# Patient Record
Sex: Female | Born: 1935 | Race: White | Hispanic: No | Smoking: Never smoker
Health system: Southern US, Community
[De-identification: ages and names within clinical notes are randomized; demographics above are authoritative.]

## PROBLEM LIST (undated history)

## (undated) DIAGNOSIS — S7292XA Unspecified fracture of left femur, initial encounter for closed fracture: Secondary | ICD-10-CM

## (undated) DIAGNOSIS — J302 Other seasonal allergic rhinitis: Secondary | ICD-10-CM

## (undated) DIAGNOSIS — W19XXXA Unspecified fall, initial encounter: Secondary | ICD-10-CM

## (undated) DIAGNOSIS — F329 Major depressive disorder, single episode, unspecified: Secondary | ICD-10-CM

## (undated) DIAGNOSIS — R296 Repeated falls: Secondary | ICD-10-CM

## (undated) DIAGNOSIS — I4891 Unspecified atrial fibrillation: Secondary | ICD-10-CM

## (undated) DIAGNOSIS — I10 Essential (primary) hypertension: Secondary | ICD-10-CM

## (undated) DIAGNOSIS — R110 Nausea: Secondary | ICD-10-CM

## (undated) HISTORY — PX: FRACTURE SURGERY: SHX138

## (undated) NOTE — *Deleted (*Deleted)
ANTICOAGULATION CONSULT NOTE - Follow Up Consult  Pharmacy Consult for heparin Indication: hx atrial fibrillation (home Eliquis on hold)  No Known Allergies  Patient Measurements: Height: 5\' 7"  (170.2 cm) Weight: 71.7 kg (158 lb) IBW/kg (Calculated) : 61.6 Heparin Dosing Weight: 72 kg  Vital Signs: Temp: 98.6 F (37 C) (11/11 0414) Temp Source: Oral (11/11 0414) BP: 120/56 (11/11 0553) Pulse Rate: 80 (11/11 0553)  Labs: Recent Labs    03/23/20 1240 03/23/20 1745 03/24/20 0603  HGB 10.6*  --  9.0*  HCT 33.3*  --  29.2*  PLT 409*  --  326  APTT  --  39* 106*  HEPARINUNFRC  --  >2.20* >2.20*  CREATININE 1.44*  --  1.10*    Estimated Creatinine Clearance: 33.7 mL/min (A) (by C-G formula based on SCr of 1.1 mg/dL (H)).   Medications:  - on Eliquis 5 mg bid PTA (last dose taken on 11/10 at 0600)  Assessment: Patient's an 82 y.o F with hx afib on Eliquis PTA presented to the ED from nursing facility on 11/10 with weakness and AMS. She was transitioned to heparin drip on admission.  Today, 03/24/2020: - heparin level is elevated but is expected d/t effect of Eliquis on lab value. APTT is  Slightly supra-therapeutic at 106 secs with rate currently running at 950 units/hr.  Will adjust heparin drip using aPTT for now until heparin level and aPTT correlate. Once they correlate, then will utilize heparin level alone for adjustment - hgb down slightly to 9, plts ok - no bleeding documented   Goal of Therapy:  Heparin level 0.3-0.7 units/ml aPTT 66-102 seconds Monitor platelets by anticoagulation protocol: Yes   Plan:  - decrease heparin drip to 8500 units/hr - check 8 hr aPTT - monitor for s/sx bleeding  Pham, Anh P 03/24/2020,7:09 AM

---

## 2018-09-08 DIAGNOSIS — S72452A Displaced supracondylar fracture without intracondylar extension of lower end of left femur, initial encounter for closed fracture: Secondary | ICD-10-CM | POA: Diagnosis present

## 2018-09-10 ENCOUNTER — Encounter (HOSPITAL_COMMUNITY): Payer: Self-pay | Admitting: *Deleted

## 2018-09-10 ENCOUNTER — Other Ambulatory Visit: Payer: Self-pay

## 2018-09-10 MED ORDER — TRANEXAMIC ACID-NACL 1000-0.7 MG/100ML-% IV SOLN
1000.0000 mg | INTRAVENOUS | Status: AC
  Administered 2018-09-11: 1000 mg via INTRAVENOUS
  Filled 2018-09-10: qty 100

## 2018-09-10 NOTE — H&P (Signed)
[facility]  [address]   [address] [name] [address] [phone]   A Division of [facility]   RE: Veronica Flowers, Flowers   [id number]      DOB: 1928-02-14 INITIAL EVALUATION:  09-08-18  REASON FOR VISIT: Follow-up left femur IM nail of her intertrochanteric fracture performed by Luiz Iron, MD at [facility] on 07-15-18.   [name] Kitchen HPI:   She had initially done well. After this she was discharged to [facility] in [place] which is why she is following up with me, at his request. She has had a few falls while there, one on 08-21-18 and one after. She has worsened in her ability to walk with pain around her thigh after these falls. She has been on Eliquis and aspirin at [facility]. She has a history of Afib  EXAMINATION: Well appearing female in no apparent distress. The left leg is obviously clinically shorter. She has some tenderness around the distal thigh and her knee. Her knee joint itself has felt a lot better since she had a cortisone shot there at one of her visits. She has a history of arthritis in the left knee.   IMAGES: I took multiple X-rays today. She unfortunately has suffered a fracture of her supracondylar femur at the tip of the nail.  Intertrochanteric fracture appears to be stable. I reviewed the X-rays  sent to me by Dr. Beverley Fiedler post operative.  This is a dramatically different appearance, it is clearly a new fracture of her distal femur.       ASSESSMENT/PLAN: I had a  long talk with her and her family. She suffered a destabilizing supracondylar fracture through the distal femur. Intertrochanteric fracture appears stable. I recommend admission to the hospital for an open reduction and internal fixation of the supracondylar femur as well as removal of her distal interlock screw.  We will plan on doing this with a lateral locking plate. I have made multiple attempts to get in touch with the staff physician at  [facility] and we will continue to try that.  I would like to hold her Eliquis starting Tuesday in anticipation of  the surgery. We will also use TXA to limit blood loss. I discussed preadmission today versus admission on the day of surgery. She and the family would prefer she stay at  [facility] and then have admission on the date of surgery so we will set her up for that. We will likely have a hospitalist consult and have her spend a day or two in observation in the hospital. For now, we will do toe touch weight bearing and hold off on physical therapy.    Jewel Baize.  Veronica Flowers Flowers, M.D.  Electronically verified by Jewel Baize. Veronica Flowers Flowers, M.D. TDMNorman Herrlich D:  09-08-18 T:  09-10-18

## 2018-09-10 NOTE — Pre-Procedure Instructions (Addendum)
    Veronica Flowers  09/10/2018     No Pharmacies Listed   Your procedure is scheduled on Thursday, September 11, 2018  Report to [facility] Admitting at 8:30 A.M.  Call this number if you have problems the morning of surgery:  [phone]   Remember: Brush your teeth the morning of surgery with your regular tooth paste.   Do not eat after midnight .  You may drink clear liquids until 4:30A.M . Clear liquids allowed are:                    Water, Juice (non-citric and without pulp), Carbonated beverages, Clear Tea, Black Coffee only, Plain Jell-O only, Gatorade and Plain Popsicles only    Take these medicines the morning of surgery with A SIP OF WATER :acetaminophen (TYLENOL),  digoxin (LANOXIN), metoprolol succinate (TOPROL-XL), sertraline (ZOLOFT), fluticasone (FLONASE)  nasal spray If needed: pain medication ( Tramadol  OR  Hydrocodone) Stop taking  vitamins, fish oil, Melatonin and herbal medications. Do not take any NSAIDs ie: Ibuprofen, Advil, Naproxen (Aleve), Motrin, BC and Goody Powder; stop now.  Do not wear jewelry, make-up or nail polish.  Do not wear lotions, powders, or perfumes, or deodorant.  Do not shave 48 hours prior to surgery.   Do not bring valuables to the hospital.  [facility] is not responsible for any belongings or valuables.  Contacts, dentures or bridgework may not be worn into surgery.  Leave your suitcase in the car.  After surgery it may be brought to your room. Patients discharged the day of surgery will not be allowed to drive home.  Please read over the following fact sheets that you were given.

## 2018-09-10 NOTE — Anesthesia Preprocedure Evaluation (Addendum)
Anesthesia Evaluation  Patient identified by MRN, date of birth, ID band Patient awake    Reviewed: Allergy & Precautions, NPO status , Patient's Chart, lab work & pertinent test results  Airway Mallampati: II  TM Distance: >3 FB Neck ROM: Full    Dental no notable dental hx. (+) Teeth Intact   Pulmonary    Pulmonary exam normal breath sounds clear to auscultation       Cardiovascular hypertension, Normal cardiovascular exam Rhythm:Regular Rate:Normal     Neuro/Psych    GI/Hepatic Neg liver ROS,   Endo/Other    Renal/GU negative Renal ROS     Musculoskeletal negative musculoskeletal ROS (+)   Abdominal   Peds  Hematology   Anesthesia Other Findings   Reproductive/Obstetrics                            Anesthesia Physical Anesthesia Plan  ASA: III  Anesthesia Plan: Spinal   Post-op Pain Management:    Induction: Intravenous  PONV Risk Score and Plan: 2 and Treatment may vary due to age or medical condition, Ondansetron and Dexamethasone  Airway Management Planned: Simple Face Mask and Natural Airway  Additional Equipment:   Intra-op Plan:   Post-operative Plan: Extubation in OR  Informed Consent: I have reviewed the patients History and Physical, chart, labs and discussed the procedure including the risks, benefits and alternatives for the proposed anesthesia with the patient or authorized representative who has indicated his/her understanding and acceptance.     Dental advisory given  Plan Discussed with: CRNA  Anesthesia Plan Comments: (Pt On aphixaban  dced after the 4/27 dose, plan spinal )       Anesthesia Quick Evaluation

## 2018-09-10 NOTE — Progress Notes (Signed)
SDW-Pre-op call completed by pt nurse, Anna Genre, LPN, of [facility]. Nurse was unable to complete SDW-pre-op assessment in its entirety due to lack of pt records at [facility]. Nurse stated that there was no record of surgical history, cardiologist and diagnostic studies. Nurse stated that pt last dose of both Aspirin and Eliquis was Monday as instructed by doctor. Nurse stated that pt has no impairment and is capable of completing assessment on DOS.  Nurse denies that pt tested positive for COVID-19.  Nurse denies that pt experienced the following symptoms:  Cough yes/no: No Fever (>100.76F)  yes/no: No Runny nose yes/no: No Sore throat yes/no: No Difficulty breathing/shortness of breath  yes/no: No  Have you or a family member traveled in the last 14 days and where? yes/no: No Nurse made aware of hospital visitation restrictions are in effect and the importance of the restrictions.  Nurse confirmed receipt of fax and verbalized understanding of all pre-op instructions.  PAT Nurse spoke with Tresa Endo, Surgical Coordinator, at surgeon's office regarding pt records. Tresa Endo denies having any cardiac records for pt stating that pt previous surgery was performed by " [facility] " in [place] where pt resides. Nurse requested records from Dr. Elwanda Brooklyn [facility]; awaiting response. PA,  Anesthesiology, asked to review pt history

## 2018-09-11 ENCOUNTER — Inpatient Hospital Stay (HOSPITAL_COMMUNITY): Payer: Medicare Other | Admitting: Physician Assistant

## 2018-09-11 ENCOUNTER — Encounter (HOSPITAL_COMMUNITY): Payer: Self-pay

## 2018-09-11 ENCOUNTER — Encounter (HOSPITAL_COMMUNITY)
Admission: RE | Disposition: A | Discharge status: Skilled Nursing Facility | Payer: Self-pay | Source: Skilled Nursing Facility | Attending: Orthopedic Surgery

## 2018-09-11 ENCOUNTER — Inpatient Hospital Stay (HOSPITAL_COMMUNITY)
Admission: RE | Admit: 2018-09-11 | Discharge: 2018-09-15 | DRG: 481 | Disposition: A | Discharge status: Skilled Nursing Facility | Payer: Medicare Other | Source: Skilled Nursing Facility | Attending: Orthopedic Surgery | Admitting: Orthopedic Surgery

## 2018-09-11 ENCOUNTER — Inpatient Hospital Stay (HOSPITAL_COMMUNITY): Payer: Medicare Other

## 2018-09-11 DIAGNOSIS — I10 Essential (primary) hypertension: Secondary | ICD-10-CM | POA: Diagnosis present

## 2018-09-11 DIAGNOSIS — Y92129 Unspecified place in nursing home as the place of occurrence of the external cause: Secondary | ICD-10-CM

## 2018-09-11 DIAGNOSIS — X58XXXD Exposure to other specified factors, subsequent encounter: Secondary | ICD-10-CM | POA: Diagnosis present

## 2018-09-11 DIAGNOSIS — R296 Repeated falls: Secondary | ICD-10-CM | POA: Diagnosis present

## 2018-09-11 DIAGNOSIS — Z419 Encounter for procedure for purposes other than remedying health state, unspecified: Secondary | ICD-10-CM

## 2018-09-11 DIAGNOSIS — Z9181 History of falling: Secondary | ICD-10-CM

## 2018-09-11 DIAGNOSIS — D649 Anemia, unspecified: Secondary | ICD-10-CM | POA: Diagnosis present

## 2018-09-11 DIAGNOSIS — F329 Major depressive disorder, single episode, unspecified: Secondary | ICD-10-CM | POA: Diagnosis present

## 2018-09-11 DIAGNOSIS — I4891 Unspecified atrial fibrillation: Secondary | ICD-10-CM | POA: Diagnosis not present

## 2018-09-11 DIAGNOSIS — W19XXXA Unspecified fall, initial encounter: Secondary | ICD-10-CM | POA: Diagnosis present

## 2018-09-11 DIAGNOSIS — Z79899 Other long term (current) drug therapy: Secondary | ICD-10-CM | POA: Diagnosis not present

## 2018-09-11 DIAGNOSIS — S72452A Displaced supracondylar fracture without intracondylar extension of lower end of left femur, initial encounter for closed fracture: Secondary | ICD-10-CM | POA: Diagnosis present

## 2018-09-11 DIAGNOSIS — I482 Chronic atrial fibrillation, unspecified: Secondary | ICD-10-CM | POA: Diagnosis present

## 2018-09-11 DIAGNOSIS — Z1159 Encounter for screening for other viral diseases: Secondary | ICD-10-CM | POA: Diagnosis not present

## 2018-09-11 DIAGNOSIS — F0390 Unspecified dementia without behavioral disturbance: Secondary | ICD-10-CM | POA: Diagnosis present

## 2018-09-11 DIAGNOSIS — Z7901 Long term (current) use of anticoagulants: Secondary | ICD-10-CM

## 2018-09-11 DIAGNOSIS — D62 Acute posthemorrhagic anemia: Secondary | ICD-10-CM | POA: Diagnosis not present

## 2018-09-11 DIAGNOSIS — S72142D Displaced intertrochanteric fracture of left femur, subsequent encounter for closed fracture with routine healing: Secondary | ICD-10-CM

## 2018-09-11 DIAGNOSIS — F0393 Unspecified dementia, unspecified severity, with mood disturbance: Secondary | ICD-10-CM | POA: Diagnosis present

## 2018-09-11 HISTORY — PX: ORIF FEMUR FRACTURE: SHX2119

## 2018-09-11 HISTORY — DX: Essential (primary) hypertension: I10

## 2018-09-11 HISTORY — DX: Unspecified fall, initial encounter: W19.XXXA

## 2018-09-11 HISTORY — DX: Nausea: R11.0

## 2018-09-11 HISTORY — DX: Unspecified fracture of left femur, initial encounter for closed fracture: S72.92XA

## 2018-09-11 HISTORY — DX: Major depressive disorder, single episode, unspecified: F32.9

## 2018-09-11 HISTORY — DX: Unspecified atrial fibrillation: I48.91

## 2018-09-11 HISTORY — DX: Repeated falls: R29.6

## 2018-09-11 HISTORY — DX: Other seasonal allergic rhinitis: J30.2

## 2018-09-11 LAB — BASIC METABOLIC PANEL
Anion gap: 11 (ref 5–15)
BUN: 14 mg/dL (ref 8–23)
CO2: 26 mmol/L (ref 22–32)
Calcium: 9.1 mg/dL (ref 8.9–10.3)
Chloride: 102 mmol/L (ref 98–111)
Creatinine, Ser: 0.97 mg/dL (ref 0.44–1.00)
GFR calc Af Amer: >60 mL/min (ref >60)
GFR calc non Af Amer: 52 mL/min — ABNORMAL LOW (ref >60)
Glucose, Bld: 116 mg/dL — ABNORMAL HIGH (ref 70–99)
Potassium: 4 mmol/L (ref 3.5–5.1)
Sodium: 139 mmol/L (ref 135–145)

## 2018-09-11 LAB — PROTIME-INR
INR: 1.2 (ref 0.8–1.2)
Prothrombin Time: 15.5 seconds — ABNORMAL HIGH (ref 11.4–15.2)

## 2018-09-11 LAB — CBC
HCT: 36.9 % (ref 36.0–46.0)
Hemoglobin: 11.6 g/dL — ABNORMAL LOW (ref 12.0–15.0)
MCH: 32.8 pg (ref 26.0–34.0)
MCHC: 31.4 g/dL (ref 30.0–36.0)
MCV: 104.2 fL — ABNORMAL HIGH (ref 80.0–100.0)
Platelets: 391 10*3/uL (ref 150–400)
RBC: 3.54 MIL/uL — ABNORMAL LOW (ref 3.87–5.11)
RDW: 14.9 % (ref 11.5–15.5)
WBC: 8.3 10*3/uL (ref 4.0–10.5)
nRBC: 0 % (ref 0.0–0.2)

## 2018-09-11 SURGERY — OPEN REDUCTION INTERNAL FIXATION (ORIF) DISTAL FEMUR FRACTURE
Anesthesia: Spinal | Site: Leg Upper | Laterality: Left

## 2018-09-11 MED ORDER — ACETAMINOPHEN 500 MG PO TABS
500.0000 mg | ORAL_TABLET | Freq: Four times a day (QID) | ORAL | Status: AC
  Administered 2018-09-11 – 2018-09-12 (×3): 500 mg via ORAL
  Filled 2018-09-11 (×3): qty 1

## 2018-09-11 MED ORDER — SUCCINYLCHOLINE CHLORIDE 200 MG/10ML IV SOSY
PREFILLED_SYRINGE | INTRAVENOUS | Status: AC
  Filled 2018-09-11: qty 10

## 2018-09-11 MED ORDER — METHOCARBAMOL 500 MG PO TABS
500.0000 mg | ORAL_TABLET | Freq: Four times a day (QID) | ORAL | Status: DC | PRN
  Administered 2018-09-11 – 2018-09-12 (×2): 500 mg via ORAL
  Filled 2018-09-11 (×2): qty 1

## 2018-09-11 MED ORDER — DIGOXIN 125 MCG PO TABS
0.1250 mg | ORAL_TABLET | Freq: Every day | ORAL | Status: DC
  Administered 2018-09-11 – 2018-09-15 (×5): 0.125 mg via ORAL
  Filled 2018-09-11 (×5): qty 1

## 2018-09-11 MED ORDER — METOCLOPRAMIDE HCL 5 MG/ML IJ SOLN: 5.0000 mg | Freq: Three times a day (TID) | INTRAMUSCULAR | Status: DC | PRN

## 2018-09-11 MED ORDER — METHOCARBAMOL 1000 MG/10ML IJ SOLN
500.0000 mg | Freq: Four times a day (QID) | INTRAVENOUS | Status: DC | PRN
  Filled 2018-09-11 (×2): qty 5

## 2018-09-11 MED ORDER — APIXABAN 5 MG PO TABS
5.0000 mg | ORAL_TABLET | Freq: Two times a day (BID) | ORAL | Status: DC
  Administered 2018-09-12 – 2018-09-15 (×7): 5 mg via ORAL
  Filled 2018-09-11 (×7): qty 1

## 2018-09-11 MED ORDER — CEFAZOLIN SODIUM-DEXTROSE 2-4 GM/100ML-% IV SOLN
2.0000 g | INTRAVENOUS | Status: AC
  Administered 2018-09-11: 2 g via INTRAVENOUS
  Filled 2018-09-11: qty 100

## 2018-09-11 MED ORDER — ONDANSETRON HCL 4 MG/2ML IJ SOLN: 4.0000 mg | Freq: Four times a day (QID) | INTRAMUSCULAR | Status: DC | PRN

## 2018-09-11 MED ORDER — POLYETHYLENE GLYCOL 3350 17 G PO PACK: 17.0000 g | PACK | Freq: Every day | ORAL | Status: DC | PRN

## 2018-09-11 MED ORDER — HYDROMORPHONE HCL 1 MG/ML IJ SOLN: 0.2500 mg | INTRAMUSCULAR | Status: DC | PRN

## 2018-09-11 MED ORDER — PHENYLEPHRINE 40 MCG/ML (10ML) SYRINGE FOR IV PUSH (FOR BLOOD PRESSURE SUPPORT)
PREFILLED_SYRINGE | INTRAVENOUS | Status: AC
  Filled 2018-09-11: qty 10

## 2018-09-11 MED ORDER — LIDOCAINE 2% (20 MG/ML) 5 ML SYRINGE
INTRAMUSCULAR | Status: AC
  Filled 2018-09-11: qty 5

## 2018-09-11 MED ORDER — ONDANSETRON HCL 4 MG/2ML IJ SOLN
INTRAMUSCULAR | Status: AC
  Filled 2018-09-11: qty 2

## 2018-09-11 MED ORDER — ONDANSETRON HCL 4 MG PO TABS: 4.0000 mg | ORAL_TABLET | Freq: Four times a day (QID) | ORAL | Status: DC | PRN

## 2018-09-11 MED ORDER — POLYETHYLENE GLYCOL 3350 17 G PO PACK
17.0000 g | PACK | Freq: Every day | ORAL | Status: DC
  Administered 2018-09-12 – 2018-09-14 (×3): 17 g via ORAL
  Filled 2018-09-11 (×4): qty 1

## 2018-09-11 MED ORDER — METOCLOPRAMIDE HCL 5 MG PO TABS: 5.0000 mg | ORAL_TABLET | Freq: Three times a day (TID) | ORAL | Status: DC | PRN

## 2018-09-11 MED ORDER — ARTIFICIAL TEARS OPHTHALMIC OINT
TOPICAL_OINTMENT | OPHTHALMIC | Status: AC
  Filled 2018-09-11: qty 3.5

## 2018-09-11 MED ORDER — ACETAMINOPHEN 10 MG/ML IV SOLN: 1000.0000 mg | Freq: Once | INTRAVENOUS | Status: DC | PRN

## 2018-09-11 MED ORDER — BUPIVACAINE IN DEXTROSE 0.75-8.25 % IT SOLN
INTRATHECAL | Status: DC | PRN
  Administered 2018-09-11: 12 mg via INTRATHECAL

## 2018-09-11 MED ORDER — ACETAMINOPHEN 500 MG PO TABS
1000.0000 mg | ORAL_TABLET | Freq: Once | ORAL | Status: AC
  Administered 2018-09-11: 1000 mg via ORAL
  Filled 2018-09-11: qty 2

## 2018-09-11 MED ORDER — FLUTICASONE PROPIONATE 50 MCG/ACT NA SUSP
1.0000 | Freq: Two times a day (BID) | NASAL | Status: DC
  Administered 2018-09-11 – 2018-09-15 (×8): 1 via NASAL
  Filled 2018-09-11: qty 16

## 2018-09-11 MED ORDER — LACTATED RINGERS IV SOLN
INTRAVENOUS | Status: DC
  Administered 2018-09-11: 16:00:00 via INTRAVENOUS
  Administered 2018-09-13: 07:00:00 75 mL/h via INTRAVENOUS

## 2018-09-11 MED ORDER — SERTRALINE HCL 50 MG PO TABS
50.0000 mg | ORAL_TABLET | Freq: Every day | ORAL | Status: DC
  Administered 2018-09-12 – 2018-09-15 (×4): 50 mg via ORAL
  Filled 2018-09-11 (×4): qty 1

## 2018-09-11 MED ORDER — EPHEDRINE 5 MG/ML INJ
INTRAVENOUS | Status: AC
  Filled 2018-09-11: qty 10

## 2018-09-11 MED ORDER — DEXAMETHASONE SODIUM PHOSPHATE 10 MG/ML IJ SOLN
INTRAMUSCULAR | Status: AC
  Filled 2018-09-11: qty 1

## 2018-09-11 MED ORDER — SENNA 8.6 MG PO TABS
1.0000 | ORAL_TABLET | Freq: Every day | ORAL | Status: DC
  Administered 2018-09-11 – 2018-09-14 (×4): 8.6 mg via ORAL
  Filled 2018-09-11 (×4): qty 1

## 2018-09-11 MED ORDER — FENTANYL CITRATE (PF) 250 MCG/5ML IJ SOLN
INTRAMUSCULAR | Status: AC
  Filled 2018-09-11: qty 5

## 2018-09-11 MED ORDER — GLYCOPYRROLATE PF 0.2 MG/ML IJ SOSY
PREFILLED_SYRINGE | INTRAMUSCULAR | Status: AC
  Filled 2018-09-11: qty 1

## 2018-09-11 MED ORDER — ONDANSETRON HCL 4 MG/2ML IJ SOLN
INTRAMUSCULAR | Status: DC | PRN
  Administered 2018-09-11: 4 mg via INTRAVENOUS

## 2018-09-11 MED ORDER — NEOSTIGMINE METHYLSULFATE 3 MG/3ML IV SOSY
PREFILLED_SYRINGE | INTRAVENOUS | Status: AC
  Filled 2018-09-11: qty 3

## 2018-09-11 MED ORDER — SODIUM CHLORIDE (PF) 0.9 % IJ SOLN
INTRAMUSCULAR | Status: AC
  Filled 2018-09-11: qty 10

## 2018-09-11 MED ORDER — ONDANSETRON HCL 4 MG/2ML IJ SOLN
INTRAMUSCULAR | Status: AC
  Filled 2018-09-11: qty 4

## 2018-09-11 MED ORDER — CEFAZOLIN SODIUM-DEXTROSE 2-4 GM/100ML-% IV SOLN
2.0000 g | Freq: Four times a day (QID) | INTRAVENOUS | Status: AC
  Administered 2018-09-11 – 2018-09-12 (×3): 2 g via INTRAVENOUS
  Filled 2018-09-11 (×3): qty 100

## 2018-09-11 MED ORDER — SODIUM CHLORIDE 0.9 % IV SOLN
INTRAVENOUS | Status: DC | PRN
  Administered 2018-09-11: 50 ug/min via INTRAVENOUS

## 2018-09-11 MED ORDER — PROPOFOL 500 MG/50ML IV EMUL
INTRAVENOUS | Status: DC | PRN
  Administered 2018-09-11: 25 ug/kg/min via INTRAVENOUS

## 2018-09-11 MED ORDER — ASPIRIN EC 81 MG PO TBEC
81.0000 mg | DELAYED_RELEASE_TABLET | Freq: Every day | ORAL | Status: DC
  Administered 2018-09-12 – 2018-09-15 (×4): 81 mg via ORAL
  Filled 2018-09-11 (×4): qty 1

## 2018-09-11 MED ORDER — ACETAMINOPHEN 325 MG PO TABS
325.0000 mg | ORAL_TABLET | Freq: Four times a day (QID) | ORAL | Status: DC | PRN
  Administered 2018-09-12 – 2018-09-14 (×2): 650 mg via ORAL
  Filled 2018-09-11 (×2): qty 2

## 2018-09-11 MED ORDER — LACTATED RINGERS IV SOLN
INTRAVENOUS | Status: DC
  Administered 2018-09-11: 12:00:00 via INTRAVENOUS

## 2018-09-11 MED ORDER — ONDANSETRON HCL 4 MG/2ML IJ SOLN: 4.0000 mg | Freq: Once | INTRAMUSCULAR | Status: DC | PRN

## 2018-09-11 MED ORDER — CHLORHEXIDINE GLUCONATE 4 % EX LIQD: 60.0000 mL | Freq: Once | CUTANEOUS | Status: DC

## 2018-09-11 MED ORDER — FENTANYL CITRATE (PF) 100 MCG/2ML IJ SOLN: 25.0000 ug | INTRAMUSCULAR | Status: DC | PRN

## 2018-09-11 MED ORDER — HYDROCODONE-ACETAMINOPHEN 5-325 MG PO TABS
1.0000 | ORAL_TABLET | Freq: Four times a day (QID) | ORAL | Status: DC | PRN
  Administered 2018-09-11 – 2018-09-12 (×2): 1 via ORAL
  Filled 2018-09-11 (×2): qty 1

## 2018-09-11 MED ORDER — TRAMADOL HCL 50 MG PO TABS
25.0000 mg | ORAL_TABLET | Freq: Four times a day (QID) | ORAL | Status: DC | PRN
  Administered 2018-09-11 – 2018-09-12 (×2): 25 mg via ORAL
  Filled 2018-09-11 (×2): qty 1

## 2018-09-11 MED ORDER — PHENYLEPHRINE HCL (PRESSORS) 10 MG/ML IV SOLN
INTRAVENOUS | Status: DC | PRN
  Administered 2018-09-11: 120 ug via INTRAVENOUS
  Administered 2018-09-11: 80 ug via INTRAVENOUS

## 2018-09-11 MED ORDER — METOPROLOL SUCCINATE ER 100 MG PO TB24
100.0000 mg | ORAL_TABLET | Freq: Two times a day (BID) | ORAL | Status: DC
  Administered 2018-09-11 – 2018-09-15 (×6): 100 mg via ORAL
  Filled 2018-09-11 (×8): qty 1

## 2018-09-11 SURGICAL SUPPLY — 63 items
BANDAGE ACE 4X5 VEL STRL LF (GAUZE/BANDAGES/DRESSINGS) ×3 IMPLANT
BANDAGE ACE 6X5 VEL STRL LF (GAUZE/BANDAGES/DRESSINGS) ×3 IMPLANT
BIT DRILL QC 3.3X195 (BIT) ×3 IMPLANT
BLADE CLIPPER SURG (BLADE) IMPLANT
BNDG ELASTIC 6X10 VLCR STRL LF (GAUZE/BANDAGES/DRESSINGS) ×3 IMPLANT
BNDG GAUZE ELAST 4 BULKY (GAUZE/BANDAGES/DRESSINGS) ×3 IMPLANT
CABLE CERLAGE W/CRIMP 1.8 (Cable) ×2 IMPLANT
CABLE CERLAGE W/CRIMP 1.8MM (Cable) ×1 IMPLANT
CAP LOCK NCB (Cap) ×27 IMPLANT
COVER WAND RF STERILE (DRAPES) IMPLANT
DRAPE C-ARM 42X72 X-RAY (DRAPES) ×3 IMPLANT
DRAPE C-ARMOR (DRAPES) ×3 IMPLANT
DRAPE IMP U-DRAPE 54X76 (DRAPES) ×3 IMPLANT
DRAPE ORTHO SPLIT 77X108 STRL (DRAPES) ×4
DRAPE SURG ORHT 6 SPLT 77X108 (DRAPES) ×2 IMPLANT
DRAPE U-SHAPE 47X51 STRL (DRAPES) ×3 IMPLANT
DRSG ADAPTIC 3X8 NADH LF (GAUZE/BANDAGES/DRESSINGS) ×3 IMPLANT
DRSG PAD ABDOMINAL 8X10 ST (GAUZE/BANDAGES/DRESSINGS) ×12 IMPLANT
ELECT REM PT RETURN 9FT ADLT (ELECTROSURGICAL) ×3
ELECTRODE REM PT RTRN 9FT ADLT (ELECTROSURGICAL) ×1 IMPLANT
GAUZE SPONGE 4X4 12PLY STRL (GAUZE/BANDAGES/DRESSINGS) ×3 IMPLANT
GLOVE BIO SURGEON STRL SZ7.5 (GLOVE) ×6 IMPLANT
GLOVE BIOGEL PI IND STRL 8 (GLOVE) ×2 IMPLANT
GLOVE BIOGEL PI INDICATOR 8 (GLOVE) ×4
GOWN STRL REUS W/ TWL LRG LVL3 (GOWN DISPOSABLE) ×4 IMPLANT
GOWN STRL REUS W/ TWL XL LVL3 (GOWN DISPOSABLE) ×1 IMPLANT
GOWN STRL REUS W/TWL LRG LVL3 (GOWN DISPOSABLE) ×8
GOWN STRL REUS W/TWL XL LVL3 (GOWN DISPOSABLE) ×2
K-WIRE 2.0 (WIRE) ×4
K-WIRE FXSTD 280X2XNS SS (WIRE) ×2
KIT BASIN OR (CUSTOM PROCEDURE TRAY) ×3 IMPLANT
KIT TURNOVER KIT B (KITS) ×3 IMPLANT
KWIRE FXSTD 280X2XNS SS (WIRE) ×2 IMPLANT
LOCKPLATE CABLE BUTTON NCP HIP (Orthopedic Implant) ×3 IMPLANT
MANIFOLD NEPTUNE II (INSTRUMENTS) ×3 IMPLANT
NS IRRIG 1000ML POUR BTL (IV SOLUTION) ×3 IMPLANT
PACK TOTAL JOINT (CUSTOM PROCEDURE TRAY) ×3 IMPLANT
PACK UNIVERSAL I (CUSTOM PROCEDURE TRAY) ×3 IMPLANT
PAD ABD 8X10 STRL (GAUZE/BANDAGES/DRESSINGS) ×6 IMPLANT
PAD ARMBOARD 7.5X6 YLW CONV (MISCELLANEOUS) ×6 IMPLANT
PAD CAST 4YDX4 CTTN HI CHSV (CAST SUPPLIES) ×1 IMPLANT
PADDING CAST COTTON 4X4 STRL (CAST SUPPLIES) ×2
PADDING CAST COTTON 6X4 STRL (CAST SUPPLIES) ×3 IMPLANT
PLATE BONE LOCK 238MM 9HOLE (Plate) ×3 IMPLANT
SCREW 5.0 60MM (Screw) ×3 IMPLANT
SCREW CORT NCB SELFTAP 5.0X50 (Screw) ×3 IMPLANT
SCREW CORTICAL 5.0X10MM (Screw) ×9 IMPLANT
SCREW CORTICAL 5.0X14 (Screw) ×3 IMPLANT
SCREW CORTICAL NCB 5.0X40 (Screw) ×3 IMPLANT
SCREW CORTICAL NCB 5.0X44 (Screw) ×3 IMPLANT
SCREW CORTICAL NCB 5.0X65 (Screw) ×3 IMPLANT
SCREW NCB 4.0X20MM (Screw) ×3 IMPLANT
SCREW NCB 5.0X55MM (Screw) ×3 IMPLANT
STAPLER VISISTAT 35W (STAPLE) IMPLANT
SUT MNCRL AB 4-0 PS2 18 (SUTURE) ×3 IMPLANT
SUT MON AB 2-0 CT1 27 (SUTURE) ×3 IMPLANT
SUT VIC AB 0 CT1 27 (SUTURE) ×4
SUT VIC AB 0 CT1 27XBRD ANBCTR (SUTURE) ×2 IMPLANT
TOWEL OR 17X24 6PK STRL BLUE (TOWEL DISPOSABLE) ×3 IMPLANT
TOWEL OR 17X26 10 PK STRL BLUE (TOWEL DISPOSABLE) ×6 IMPLANT
TOWEL OR NON WOVEN STRL DISP B (DISPOSABLE) ×3 IMPLANT
TRAY FOLEY MTR SLVR 16FR STAT (SET/KITS/TRAYS/PACK) IMPLANT
WATER STERILE IRR 1000ML POUR (IV SOLUTION) ×6 IMPLANT

## 2018-09-11 NOTE — Consult Note (Signed)
Medical Consultation   Veronica GarnetMary Flowers  WUJ:[id number]  DOB: 03-04-1928  DOA: 09/11/2018  PCP: System, Pcp Not In   Outpatient Specialists: Dr Eulah PontMurphy: Orthopedics   Requesting physician: Dr. Eulah PontMurphy from orthopedics  Reason for consultation: Medical management of hypertension and atrial fibrillation   History of Present Illness: Veronica GarnetMary Flowers is an 85 y.o. female very poor historian who was recuperating from a left femur IM nail of an intertrochanteric fracture performed by Dr. Luiz Ironlint McNabb at [facility] on 07/15/2018.  She had initially done well and was discharged to [facility] in [place] which has had a few falls while at [facility] the last 1 on 08/21/2018 and then 1 after that.  Billy to walk has declined with significant pain around her thigh after these falls.  She is on Eliquis and aspirin due to history of atrial fibrillation.  Evaluation by Dr. Eulah PontMurphy on the 29th in his office revealed supracondylar femur x-ray at the tip of the nail.  Clearly had developed a new fracture since Dr. Eulah PontMurphy was able to compare the x-rays from Dr. Arville LimeMcNabb's postoperative x-rays.  She presented today for open reduction internal fixation of the supracondylar femur and removal of distal interlock screw.  And her medical history of hypertension, atrial fibrillation and what appears to be some mild dementia I was consulted for medical management.    Review of Systems:  Review of Systems  Unable to perform ROS: Dementia     Past Medical History: Past Medical History:  Diagnosis Date  . A-fib (HCC)   . Falls   . Femur fracture, left (HCC)    supracondylar  . Hypertension   . Major depressive disorder   . Nausea   . Seasonal allergies     Past Surgical History: Past Surgical History:  Procedure Laterality Date  . FRACTURE SURGERY     left femur     Allergies:  No Known Allergies   Social History:  reports that she has never smoked. She has never used smokeless tobacco.  She reports previous alcohol use. She reports that she does not use drugs.   Family History: Family History  Family history unknown: Yes      Physical Exam: Vitals:   09/11/18 1510 09/11/18 1516 09/11/18 1531 09/11/18 1559  BP:  (!) 89/55 (!) 109/36 106/76  Pulse:   (!) 59 60  Resp:   (!) 21 (!) 22  Temp: 97.6 F (36.4 C)     TempSrc:      SpO2:   100% 98%  Weight:      Height:        Constitutional: Elderly confused alert and awake, oriented x3, not in any acute distress. Eyes: PERLA, EOMI, irises appear normal, anicteric sclera,  ENMT: external ears and nose appear normal, normal hearing            Lips appears normal, oropharynx mucosa, tongue, posterior pharynx appear normal  Neck: neck appears normal, no masses, normal ROM, no thyromegaly, no JVD  CVS: Irregularly irregular S1-S2 clear, no murmur rubs or gallops, no LE edema, normal pedal pulses  Respiratory:  clear to auscultation bilaterally, no wheezing, rales or rhonchi. Respiratory effort normal. No accessory muscle use.  Abdomen: soft nontender, nondistended, normal bowel sounds, no hepatosplenomegaly, no hernias  Musculoskeletal: : no cyanosis, clubbing or edema noted bilaterally leg in an immobilizer no evidence of bleeding through the dressing Neuro: Cranial nerves II-XII intact,  strength, sensation, reflexes Psych: judgement and insight appear normal, stable mood and affect, mental status Skin: no rashes or lesions or ulcers, no induration or nodules    Data reviewed:  I have personally reviewed following labs and imaging studies Labs:  CBC: Recent Labs  Lab 09/11/18 1004  WBC 8.3  HGB 11.6*  HCT 36.9  MCV 104.2*  PLT 391    Basic Metabolic Panel: Recent Labs  Lab 09/11/18 1004  NA 139  K 4.0  CL 102  CO2 26  GLUCOSE 116*  BUN 14  CREATININE 0.97  CALCIUM 9.1   GFR Estimated Creatinine Clearance: 42.3 mL/min (by C-G formula based on SCr of 0.97 mg/dL).  Coagulation profile Recent Labs   Lab 09/11/18 1004  INR 1.2    Microbiology No results found for this or any previous visit (from the past 240 hour(s)).     Inpatient Medications:   Scheduled Meds: . acetaminophen  500 mg Oral Q6H  . [START ON 09/12/2018] apixaban  5 mg Oral BID  . [START ON 09/12/2018] aspirin EC  81 mg Oral Daily  . digoxin  0.125 mg Oral Daily  . fluticasone  1 spray Each Nare BID  . metoprolol succinate  100 mg Oral BID  . polyethylene glycol  17 g Oral Daily  . senna  1 tablet Oral QHS  . [START ON 09/12/2018] sertraline  50 mg Oral Daily   Continuous Infusions: .  ceFAZolin (ANCEF) IV    . lactated ringers 50 mL/hr at 09/11/18 1608  . methocarbamol (ROBAXIN) IV       Radiological Exams on Admission: Dg C-arm 1-60 Min  Result Date: 09/11/2018 CLINICAL DATA:  85 year old female with ORIF femur EXAM: DG C-ARM 61-120 MIN; LEFT FEMUR 2 VIEWS COMPARISON:  None. FINDINGS: Multiple intraoperative fluoroscopic spot images of left femur. Surgical changes of open reduction internal fixation with antegrade intramedullary rod and buttress plate and screw fixation. IMPRESSION: Limited intraoperative fluoroscopic spot images of ORIF of left femur. Please refer to the dictated operative report for full details of intraoperative findings and procedure. Electronically Signed   By: Gilmer Mor D.O.   On: 09/11/2018 14:45   Dg Femur Min 2 Views Left  Result Date: 09/11/2018 CLINICAL DATA:  85 year old female with ORIF femur EXAM: DG C-ARM 61-120 MIN; LEFT FEMUR 2 VIEWS COMPARISON:  None. FINDINGS: Multiple intraoperative fluoroscopic spot images of left femur. Surgical changes of open reduction internal fixation with antegrade intramedullary rod and buttress plate and screw fixation. IMPRESSION: Limited intraoperative fluoroscopic spot images of ORIF of left femur. Please refer to the dictated operative report for full details of intraoperative findings and procedure. Electronically Signed   By: Gilmer Mor  D.O.   On: 09/11/2018 14:45    Impression/Recommendations Principal Problem:   Displaced supracondylar fracture without intracondylar extension of lower end of left femur, initial encounter for closed fracture [facility]) Active Problems:   Closed displaced supracondylar fracture of distal end of left femur without intracondylar extension (HCC)   Unspecified atrial fibrillation (HCC)   Chronic anticoagulation   Essential hypertension   Dementia, presenile with depression (HCC)   1.  Displaced subcondylar fracture without intercondylar extension of lower end of left femur and close displaced supracondylar fracture of distal end of left femur: Judgment poor orthopedics.  2.  Unspecified atrial fibrillation: Patient is anticoagulated with Eliquis and takes metoprolol and digoxin.  We will continue these home medications.  It is currently well controlled.  3.  Chronic anticoagulation:  Noted as above.  4.  Essential hypertension: Currently blood pressure well controlled with her metoprolol.  5.  Dementia presenile with depression: Very slow to respond to questions not able to really give me too many answers.  Continue sertraline.  Thank you for this consultation.  Our [facility] hospitalist team will follow the patient with you.   Time Spent: 47 minutes  Lahoma Crocker M.D. [facility] 09/11/2018, 5:05 PM

## 2018-09-11 NOTE — Anesthesia Procedure Notes (Signed)
Spinal  Patient location during procedure: OR Start time: 09/11/2018 12:35 PM End time: 09/11/2018 12:42 PM Staffing Anesthesiologist: Trevor Iha, MD Preanesthetic Checklist Completed: patient identified, site marked, surgical consent, pre-op evaluation, timeout performed, IV checked, risks and benefits discussed and monitors and equipment checked Spinal Block Patient position: sitting Prep: DuraPrep Patient monitoring: heart rate, cardiac monitor, continuous pulse ox and blood pressure Approach: midline Location: L3-4 Injection technique: single-shot Needle Needle type: Sprotte  Needle gauge: 24 G Needle length: 9 cm Assessment Sensory level: T4 Additional Notes 1 attempt . Patient tolerated procedure well.

## 2018-09-11 NOTE — Interval H&P Note (Signed)
I participated in the care of this patient and agree with the above history, physical and evaluation. I performed a review of the history and a physical exam as detailed   Mikeisha Lemonds Daniel Rafiel Mecca MD  

## 2018-09-11 NOTE — Progress Notes (Signed)
Patient admitted to [facility] room1. S/P ORIF left distal femur fracture. Patient sl. Confused. Easily re-oriented. Call bell in reach.  Ace wrap and knee immobilizer intact. Toes cool to touch. Pt able to wiggle toes. Sensation intact. Continue to monitor.

## 2018-09-11 NOTE — Transfer of Care (Signed)
Immediate Anesthesia Transfer of Care Note  Patient: Veronica Flowers  Procedure(s) Performed: OPEN REDUCTION INTERNAL FIXATION (ORIF) DISTAL FEMUR FRACTURE (Left Leg Upper)  Patient Location: PACU  Anesthesia Type:Spinal  Level of Consciousness: awake  Airway & Oxygen Therapy: Patient Spontanous Breathing and Patient connected to nasal cannula oxygen  Post-op Assessment: Report given to RN and Post -op Vital signs reviewed and stable  Post vital signs: Reviewed and stable  Last Vitals: See flowsheet Vitals Value Taken Time  BP    Temp    Pulse    Resp    SpO2      Last Pain:  Vitals:   09/11/18 1659  TempSrc:   PainSc: 10-Worst pain ever      Patients Stated Pain Goal: 3 (09/11/18 0921)  Complications: No apparent anesthesia complications

## 2018-09-11 NOTE — Anesthesia Postprocedure Evaluation (Signed)
Anesthesia Post Note  Patient: Veronica Flowers  Procedure(s) Performed: OPEN REDUCTION INTERNAL FIXATION (ORIF) DISTAL FEMUR FRACTURE (Left Leg Upper)     Patient location during evaluation: Nursing Unit Anesthesia Type: Spinal Level of consciousness: oriented and awake and alert Pain management: pain level controlled Vital Signs Assessment: post-procedure vital signs reviewed and stable Respiratory status: spontaneous breathing and respiratory function stable Cardiovascular status: blood pressure returned to baseline and stable Postop Assessment: no headache, no backache, no apparent nausea or vomiting and patient able to bend at knees Anesthetic complications: no    Last Vitals:  Vitals:   09/11/18 1810 09/11/18 2026  BP: 128/64 (!) 128/45  Pulse: 78 66  Resp: 16 17  Temp: 36.7 C (!) 36.4 C  SpO2: 100% 100%    Last Pain:  Vitals:   09/11/18 2026  TempSrc: Oral  PainSc:                  Trevor Iha

## 2018-09-12 ENCOUNTER — Encounter (HOSPITAL_COMMUNITY): Payer: Self-pay | Admitting: Orthopedic Surgery

## 2018-09-12 LAB — CBC
HCT: 27.6 % — ABNORMAL LOW (ref 36.0–46.0)
Hemoglobin: 8.7 g/dL — ABNORMAL LOW (ref 12.0–15.0)
MCH: 32.7 pg (ref 26.0–34.0)
MCHC: 31.5 g/dL (ref 30.0–36.0)
MCV: 103.8 fL — ABNORMAL HIGH (ref 80.0–100.0)
Platelets: 310 10*3/uL (ref 150–400)
RBC: 2.66 MIL/uL — ABNORMAL LOW (ref 3.87–5.11)
RDW: 14.7 % (ref 11.5–15.5)
WBC: 9.5 10*3/uL (ref 4.0–10.5)
nRBC: 0 % (ref 0.0–0.2)

## 2018-09-12 LAB — MRSA PCR SCREENING: MRSA by PCR: NEGATIVE

## 2018-09-12 NOTE — Discharge Instructions (Signed)
Elevate leg at all times to reduce pain and swelling.   Pump foot up and down frequently to reduce swelling in your foot and lower leg.  Diet: As you were doing prior to hospitalization.  Dressing:  Keep dressings on and dry until follow up.  Weight Bearing:   50% weight bearing.  Okay for Range of Motion at the knee.  Knee immobilizer for comfort when ambulating.  To prevent constipation: you may use a stool softener such as -  Colace (over the counter) 100 mg by mouth twice a day  Drink plenty of fluids (prune juice may be helpful) and high fiber foods Miralax (over the counter) for constipation as needed.    Itching:  If you experience itching with your medications, try taking only a single pain pill, or even half a pain pill at a time.  You can also use benadryl over the counter for itching or also to help with sleep.   Precautions:  If you experience chest pain or shortness of breath - call 911 immediately for transfer to the hospital emergency department!!  If you develop a fever greater that 101 F, purulent drainage from wound, increased redness or drainage from wound, or calf pain -- Call the office at [phone]                                                 Follow- Up Appointment:  Please call for an appointment to be seen in 1-2 weeks [facility] - [phone]

## 2018-09-12 NOTE — Clinical Social Work Note (Signed)
Patient from [facility]. PT evaluation pending. Will follow for therapy recommendations.  Charlynn Court, CSW [phone]

## 2018-09-12 NOTE — Progress Notes (Signed)
[facility] PROGRESS NOTE  Veronica GarnetMary Flowers ZOX:[id number] DOB: 1927/09/12 DOA: 09/11/2018 PCP: System, Pcp Not In   Consult requested by dr Eulah Pontmurphy Reason consult: medical management of HTN and afib  Assessment/Plan:  1.  Displaced subcondylar fracture without intercondylar extension of lower end of left femur and close displaced supracondylar fracture of distal end of left femur: Judgment poor orthopedics.  2.  Unspecified atrial fibrillation: reate controlled.  Patient remains anticoagulated with Eliquis and takes metoprolol and digoxin.  recommend continuing these home medications.    3.  Chronic anticoagulation: Noted as above.  4.  Essential hypertension: controlled. Recommend continuing home regimen  5.  Dementia present with depression: Awake alert. appears stable at baseline this am. Continue sertraline.  Ok for discharge from medicine perspective  Thank you for opportunity to participate in this patient's care.     Code Status: full Family Communication:  Disposition Plan: back to facility once PT evaaluates     Procedures:    Antibiotics:    HPI/Subjective: Consult for medical management of HTN and afib after ORIF 4/30  Objective: Vitals:   09/12/18 0828 09/12/18 1248  BP: 122/62 (!) 109/38  Pulse: 80 73  Resp:    Temp:  97.8 F (36.6 C)  SpO2: 99% 94%    Intake/Output Summary (Last 24 hours) at 09/12/2018 1256 Last data filed at 09/12/2018 0600 Gross per 24 hour  Intake 1837.46 ml  Output 300 ml  Net 1537.46 ml   Filed Weights   09/11/18 0903  Weight: 74.8 kg    Exam:   General:  Awake alert no acute distress  Cardiovascular: irregularly irregular no mgr no LE edema  Respiratory: normal effort BS clear bilaterally no wheeze  Abdomen: +BS no guarding or rebounding  Musculoskeletal: joints without swelling/erythema   Data Reviewed: Basic Metabolic Panel: Recent Labs  Lab 09/11/18 1004  NA 139  K 4.0  CL 102  CO2 26   GLUCOSE 116*  BUN 14  CREATININE 0.97  CALCIUM 9.1   Liver Function Tests: No results for input(s): AST, ALT, ALKPHOS, BILITOT, PROT, ALBUMIN in the last 168 hours. No results for input(s): LIPASE, AMYLASE in the last 168 hours. No results for input(s): AMMONIA in the last 168 hours. CBC: Recent Labs  Lab 09/11/18 1004 09/12/18 0114  WBC 8.3 9.5  HGB 11.6* 8.7*  HCT 36.9 27.6*  MCV 104.2* 103.8*  PLT 391 310   Cardiac Enzymes: No results for input(s): CKTOTAL, CKMB, CKMBINDEX, TROPONINI in the last 168 hours. BNP (last 3 results) No results for input(s): BNP in the last 8760 hours.  ProBNP (last 3 results) No results for input(s): PROBNP in the last 8760 hours.  CBG: No results for input(s): GLUCAP in the last 168 hours.  Recent Results (from the past 240 hour(s))  MRSA PCR Screening     Status: None   Collection Time: 09/11/18 11:12 PM  Result Value Ref Range Status   MRSA by PCR NEGATIVE NEGATIVE Final    Comment:        The GeneXpert MRSA Assay (FDA approved for NASAL specimens only), is one component of a comprehensive MRSA colonization surveillance program. It is not intended to diagnose MRSA infection nor to guide or monitor treatment for MRSA infections. Performed at [facility], [address] [place], [place] [address]      Studies: Dg C-arm 1-60 Min  Result Date: 09/11/2018 CLINICAL DATA:  85 year old female with ORIF femur EXAM: DG C-ARM 61-120 MIN; LEFT  FEMUR 2 VIEWS COMPARISON:  None. FINDINGS: Multiple intraoperative fluoroscopic spot images of left femur. Surgical changes of open reduction internal fixation with antegrade intramedullary rod and buttress plate and screw fixation. IMPRESSION: Limited intraoperative fluoroscopic spot images of ORIF of left femur. Please refer to the dictated operative report for full details of intraoperative findings and procedure. Electronically Signed   By: Gilmer Mor D.O.   On: 09/11/2018 14:45   Dg  Femur Min 2 Views Left  Result Date: 09/11/2018 CLINICAL DATA:  85 year old female with ORIF femur EXAM: DG C-ARM 61-120 MIN; LEFT FEMUR 2 VIEWS COMPARISON:  None. FINDINGS: Multiple intraoperative fluoroscopic spot images of left femur. Surgical changes of open reduction internal fixation with antegrade intramedullary rod and buttress plate and screw fixation. IMPRESSION: Limited intraoperative fluoroscopic spot images of ORIF of left femur. Please refer to the dictated operative report for full details of intraoperative findings and procedure. Electronically Signed   By: Gilmer Mor D.O.   On: 09/11/2018 14:45    Scheduled Meds: . acetaminophen  500 mg Oral Q6H  . apixaban  5 mg Oral BID  . aspirin EC  81 mg Oral Daily  . digoxin  0.125 mg Oral Daily  . fluticasone  1 spray Each Nare BID  . metoprolol succinate  100 mg Oral BID  . polyethylene glycol  17 g Oral Daily  . senna  1 tablet Oral QHS  . sertraline  50 mg Oral Daily   Continuous Infusions: . lactated ringers 75 mL/hr at 09/12/18 0725  . methocarbamol (ROBAXIN) IV      Principal Problem:   Displaced supracondylar fracture without intracondylar extension of lower end of left femur, initial encounter for closed fracture (HCC) Active Problems:   Closed displaced supracondylar fracture of distal end of left femur without intracondylar extension (HCC)   Unspecified atrial fibrillation (HCC)   Chronic anticoagulation   Essential hypertension   Dementia, presenile with depression (HCC)    Time spent: 30 minutes    [facility] M  [facility]  If 7PM-7AM, please contact night-coverage at [email or web address], password [facility] 09/12/2018, 12:56 PM  LOS: 1 day

## 2018-09-12 NOTE — Evaluation (Signed)
Physical Therapy Evaluation Patient Details Name: Veronica Flowers MRN: [id number] DOB: 10-27-1927 Today's Date: 09/12/2018   History of Present Illness  Pt is an 85 y.o. female with recent intertrochanteric fx s/p IM nail (07/15/18) with d/c to SNF, now [facility] after multiple falls with difficulty walking. Pt with L supracondylar femur fx, s/p ORIF 09/11/18. PMH includes afib, HTN, dementia.    Clinical Impression  Pt presents with an overall decrease in functional mobility secondary to above. Pt poor historian; per chart, from [facility]. Today, pt required maxA to stand pivot with RW; pt limited by generalized weakness and difficulty accepting weight through LLE. Pt would benefit from continued acute PT services to maximize functional mobility and independence prior to d/c with SNF-level therapies.     Follow Up Recommendations SNF;Supervision/Assistance - 24 hour    Equipment Recommendations  (TBD)    Recommendations for Other Services       Precautions / Restrictions Precautions Precautions: Fall Restrictions Weight Bearing Restrictions: Yes LLE Weight Bearing: Partial weight bearing LLE Partial Weight Bearing Percentage or Pounds: 50% Other Position/Activity Restrictions: Knee ROM okay      Mobility  Bed Mobility Overal bed mobility: Needs Assistance Bed Mobility: Supine to Sit     Supine to sit: Max assist;HOB elevated     General bed mobility comments: Pt not initiating movement despite max cues; eventually requiring maxA to initiate and move LLE, pt able to assist minimally with RLE. MaxA for trunk elevation with UE support  Transfers Overall transfer level: Needs assistance Equipment used: Rolling walker (2 wheeled) Transfers: Sit to/from BJ'sStand;Stand Pivot Transfers Sit to Stand: Max assist;From elevated surface Stand pivot transfers: Max assist;From elevated surface       General transfer comment: MaxA to  assist trunk elevation, pt able to assist with BUEs but unable to accept weight onto LLE, easily fatigued. Stood again, unable to initiate steps, requiring maxA to pivot to recliner  Ambulation/Gait                Stairs            Wheelchair Mobility    Modified Rankin (Stroke Patients Only)       Balance Overall balance assessment: Needs assistance   Sitting balance-Leahy Scale: Fair       Standing balance-Leahy Scale: Zero Standing balance comment: BUE support and maxA to maintain standing                             Pertinent Vitals/Pain Pain Assessment: Faces Faces Pain Scale: Hurts a little bit Pain Location: LLE Pain Descriptors / Indicators: Guarding;Grimacing Pain Intervention(s): Limited activity within patient's tolerance;Repositioned    Home Living Family/patient expects to be discharged to:: Skilled nursing facility                 Additional Comments: Pt poor historian, reports [place] at [facility] and son lives next door. Per chart, from [facility]    Prior Function Level of Independence: Needs assistance   Gait / Transfers Assistance Needed: Pt poor historian. Reports using RW, then reports she doesn't use anything  ADL's / Homemaking Assistance Needed: Reports she gets assist with bathing and some meals provided, but does not need help with anything else        Hand Dominance        Extremity/Trunk Assessment   Upper Extremity Assessment Upper Extremity Assessment:  Generalized weakness    Lower Extremity Assessment Lower Extremity Assessment: Generalized weakness;LLE deficits/detail LLE Deficits / Details: Observed functional strength <3/5 LLE Coordination: decreased gross motor;decreased fine motor       Communication   Communication: HOH  Cognition Arousal/Alertness: Awake/alert Behavior During Therapy: Flat affect Overall Cognitive Status: History of cognitive impairments - at  baseline Area of Impairment: Orientation;Attention;Memory;Following commands;Safety/judgement;Awareness;Problem solving                 Orientation Level: Disoriented to;Place;Time;Situation Current Attention Level: Sustained Memory: Decreased short-term memory;Decreased recall of precautions Following Commands: Follows one step commands with increased time Safety/Judgement: Decreased awareness of safety;Decreased awareness of deficits Awareness: Intellectual Problem Solving: Requires verbal cues General Comments: Baseline dementia      General Comments      Exercises     Assessment/Plan    PT Assessment Patient needs continued PT services  PT Problem List Decreased strength;Decreased range of motion;Decreased activity tolerance;Decreased balance;Decreased mobility;Decreased cognition;Decreased knowledge of use of DME;Decreased safety awareness;Decreased knowledge of precautions;Pain       PT Treatment Interventions DME instruction;Gait training;Functional mobility training;Therapeutic activities;Therapeutic exercise;Balance training;Patient/family education    PT Goals (Current goals can be found in the Care Plan section)  Acute Rehab PT Goals Patient Stated Goal: None stated PT Goal Formulation: With patient Time For Goal Achievement: 09/26/18 Potential to Achieve Goals: Fair    Frequency Min 3X/week   Barriers to discharge        Co-evaluation               AM-PAC PT "6 Clicks" Mobility  Outcome Measure Help needed turning from your back to your side while in a flat bed without using bedrails?: A Lot Help needed moving from lying on your back to sitting on the side of a flat bed without using bedrails?: A Lot Help needed moving to and from a bed to a chair (including a wheelchair)?: A Lot Help needed standing up from a chair using your arms (e.g., wheelchair or bedside chair)?: A Lot Help needed to walk in hospital room?: Total Help needed climbing  3-5 steps with a railing? : Total 6 Click Score: 10    End of Session Equipment Utilized During Treatment: Gait belt Activity Tolerance: Patient tolerated treatment well Patient left: in chair;with call bell/phone within reach;with chair alarm set Nurse Communication: Mobility status PT Visit Diagnosis: Other abnormalities of gait and mobility (R26.89);Muscle weakness (generalized) (M62.81)    Time: 4098-1191 PT Time Calculation (min) (ACUTE ONLY): 27 min   Charges:   PT Evaluation $PT Eval Moderate Complexity: 1 Mod PT Treatments $Therapeutic Activity: 8-22 mins       Ina Homes, PT, DPT Acute Rehabilitation Services  Pager [phone] Office [phone]  Malachy Chamber 09/12/2018, 4:56 PM

## 2018-09-12 NOTE — Progress Notes (Signed)
History: The patient had IM nail of her intertrochanteric fracture performed by Luiz Ironlint McNabb, MD at [facility] on 07-15-18.  She had initially done well. After this she was discharged [facility] in [place]. She has had a few falls while there, one on 08-21-18 and one after. She has worsened in her ability to walk with pain around her thigh after these falls. She has been on Eliquis and aspirin at [facility]. She has a history of Afib.  X-rays were taken on 09/08/2018 follow-up with Dr. Eulah PontMurphy.  She unfortunately has suffered a fracture of her supracondylar femur at the tip of the nail.  She underwent ORIF 09/11/2018.  Subjective: Patient reports pain as moderate, controlled.  Tolerating some diet.  Urinating - smartwick in place. No CP, SOB.  Not yet mobilized.  Objective:   VITALS:   Vitals:   09/11/18 1717 09/11/18 1810 09/11/18 2026 09/12/18 0435  BP: 129/66 128/64 (!) 128/45 124/66  Pulse: 69 78 66 65  Resp:  16 17 17   Temp:  98 F (36.7 C) (!) 97.5 F (36.4 C) 97.7 F (36.5 C)  TempSrc:  Oral Oral Oral  SpO2:  100% 100% 100%  Weight:      Height:       CBC Latest Ref Rng & Units 09/12/2018 09/11/2018  WBC 4.0 - 10.5 K/uL 9.5 8.3  Hemoglobin 12.0 - 15.0 g/dL 2.9(F8.7(L) 11.6(L)  Hematocrit 36.0 - 46.0 % 27.6(L) 36.9  Platelets 150 - 400 K/uL 310 391   BMP Latest Ref Rng & Units 09/11/2018  Glucose 70 - 99 mg/dL 621(H116(H)  BUN 8 - 23 mg/dL 14  Creatinine 0.860.44 - 5.781.00 mg/dL 4.690.97  Sodium 629135 - 528145 mmol/L 139  Potassium 3.5 - 5.1 mmol/L 4.0  Chloride 98 - 111 mmol/L 102  CO2 22 - 32 mmol/L 26  Calcium 8.9 - 10.3 mg/dL 9.1   Intake/Output      04/30 0701 - 05/01 0700 05/01 0701 - 05/02 0700   P.O. 120    I.V. (mL/kg) 1517.5 (20.3)    IV Piggyback 200    Total Intake(mL/kg) 1837.5 (24.6)    Urine (mL/kg/hr) 150    Blood 150    Total Output 300    Net +1537.5            Physical Exam: General: NAD.  Supine in bed.  Sleepy but conversant and responds appropriately to  questions. Resp: No increased wob Cardio: regular rate ABD soft Neurologically intact MSK LLE: Feet warm.  Ace wrap and dressings in place, CDI. EHL, FHL, dorsiflexion, plantarflexion intact.  Gross sensation intact distally.  Assessment: 1 Day Post-Op  S/P Procedure(s) (LRB): OPEN REDUCTION INTERNAL FIXATION (ORIF) DISTAL FEMUR FRACTURE (Left) by Dr. Jewel Baizeimothy D. Eulah PontMurphy on 09/11/2018  Principal Problem:   Displaced supracondylar fracture without intracondylar extension of lower end of left femur, initial encounter for closed fracture [facility](HCC) Active Problems:   Closed displaced supracondylar fracture of distal end of left femur without intracondylar extension (HCC)   Unspecified atrial fibrillation (HCC)   Chronic anticoagulation   Essential hypertension   Dementia, presenile with depression (HCC)  ABLA: Hemoglobin 8.7<11.6.  No sign of active bleeding.  Asymptomatic.  Likely with delutional component.  Continue to follow hemoglobin and symptoms.  Closed displaced supracondylar femur fracture after IM nail Now status post ORIF left distal femur.  AFVSN Pain controlled Tolerating some diet and voiding Not yet mobilized- previously utilizing a walker but given multiple falls and new injury, she has  needed significant assistance.  Plan: Up with therapy D/C IV fluids when tolerating adequate p.o. Incentive Spirometry Elevate and Apply ice Appreciate medical team following for chronic conditions.  Weightbearing: PWB 50 % LLE.  Okay for knee range of motion Insicional and dressing care: Dressings left intact until follow-up Orthopedic device(s): Knee immobilizer for comfort when mobilizing. Showering: Keep dressing dry VTE prophylaxis: Resume Eliquis and 81 mg aspirin, SCDs, ambulation Pain control: Tylenol for mild pain.  Tramadol for moderate pain.  Norco for severe pain.  Dilaudid for breakthrough pain not controlled p.o. medicines. Follow - up plan: 1 to 2 weeks in the office  with Dr. Eulah Pont.   Dispo: Remain inpatient pending therapy evaluation.  Discharge back to SNF [facility]) when mobilized and stable from a medical perspective.     Anticipated LOS equal to or greater than 2 midnights due to - Age 24 and older with one or more of the following:  - Obesity  - Expected need for hospital services (PT, OT, Nursing) required for safe  discharge  - Anticipated need for postoperative skilled nursing care or inpatient rehab  - Active co-morbidities: Cardiac Arrhythmia   Albina Billet III, PA-C 09/12/2018, 7:05 AM

## 2018-09-13 LAB — CBC
HCT: 25.5 % — ABNORMAL LOW (ref 36.0–46.0)
Hemoglobin: 8.1 g/dL — ABNORMAL LOW (ref 12.0–15.0)
MCH: 32.4 pg (ref 26.0–34.0)
MCHC: 31.8 g/dL (ref 30.0–36.0)
MCV: 102 fL — ABNORMAL HIGH (ref 80.0–100.0)
Platelets: 283 10*3/uL (ref 150–400)
RBC: 2.5 MIL/uL — ABNORMAL LOW (ref 3.87–5.11)
RDW: 15.3 % (ref 11.5–15.5)
WBC: 10.7 10*3/uL — ABNORMAL HIGH (ref 4.0–10.5)
nRBC: 0 % (ref 0.0–0.2)

## 2018-09-13 NOTE — Progress Notes (Signed)
Notified [facility] of patient's blood pressure 120/49.

## 2018-09-13 NOTE — Progress Notes (Signed)
Called son, Harvie Heck and daughter in law and gave them an update on pt progress.  Ortho also came in and spoke with the family on the telephone on pt condition.  Pt will need SNF.

## 2018-09-13 NOTE — Progress Notes (Addendum)
Patient ID: Veronica Flowers, female   DOB: 1927/10/25, 85 y.o.   MRN: [id number]     Subjective:  Patient reports pain as mild to moderate.  Patient in bed and in no acute distress.  Alert and follows commands but confused about where she is.  Talked with family on the telephone and that is about baseline for her.    Objective:   VITALS:   Vitals:   09/12/18 0435 09/12/18 0828 09/12/18 1248 09/13/18 0555  BP: 124/66 122/62 (!) 109/38 (!) 120/49  Pulse: 65 80 73 71  Resp: 17     Temp: 97.7 F (36.5 C)  97.8 F (36.6 C) 98.4 F (36.9 C)  TempSrc: Oral  Oral Oral  SpO2: 100% 99% 94%   Weight:      Height:        ABD soft Sensation intact distally Dorsiflexion/Plantar flexion intact Incision: dressing C/D/I and no drainage Knee immobilizer in place   Lab Results  Component Value Date   WBC 10.7 (H) 09/13/2018   HGB 8.1 (L) 09/13/2018   HCT 25.5 (L) 09/13/2018   MCV 102.0 (H) 09/13/2018   PLT 283 09/13/2018   BMET    Component Value Date/Time   NA 139 09/11/2018 1004   K 4.0 09/11/2018 1004   CL 102 09/11/2018 1004   CO2 26 09/11/2018 1004   GLUCOSE 116 (H) 09/11/2018 1004   BUN 14 09/11/2018 1004   CREATININE 0.97 09/11/2018 1004   CALCIUM 9.1 09/11/2018 1004   GFRNONAA 52 (L) 09/11/2018 1004   GFRAA >60 09/11/2018 1004     Assessment/Plan: 2 Days Post-Op   Principal Problem:   Displaced supracondylar fracture without intracondylar extension of lower end of left femur, initial encounter for closed fracture (HCC) Active Problems:   Closed displaced supracondylar fracture of distal end of left femur without intracondylar extension (HCC)   Unspecified atrial fibrillation (HCC)   Chronic anticoagulation   Essential hypertension   Dementia, presenile with depression (HCC)   Advance diet Up with therapy Dry dressing PRN 50% WB left lower ext Knee immobilizer at all times Patient will likely need SNF Consult CSW for possible placement if [facility]  assisted living is not enough     Nelda Severe 09/13/2018, 10:10 AM  ABLA observe.  Agree with above.  Teryl Lucy, MD Cell [phone]

## 2018-09-13 NOTE — Progress Notes (Signed)
PROGRESS NOTE    Veronica GarnetMary Flowers  JXB:[id number] DOB: Sep 24, 1927 DOA: 09/11/2018 PCP: System, Pcp Not In    Brief Narrative:  85 year old female with recent femoral shaft fracture status post ORIF admitted after fall and found to have periprosthetic fracture.   Assessment & Plan:   Principal Problem:   Displaced supracondylar fracture without intracondylar extension of lower end of left femur, initial encounter for closed fracture [facility](HCC) Active Problems:   Closed displaced supracondylar fracture of distal end of left femur without intracondylar extension (HCC)   Unspecified atrial fibrillation (HCC)   Chronic anticoagulation   Essential hypertension   Dementia, presenile with depression (HCC)  1. Displaced subcondylar fracture without intercondylar extension of lower end of left femur and closed displaced supracondylar fracture of distal end of left femur:  Status post ORIF.  Date to postop today.  Enough pain medications.  PT OT.  Patient may need SNF placement. Many SNF now required COVID-19 test.  Will order one.  2.  Chronic atrial fibrillation: reate controlled.  Patient remains anticoagulated with Eliquis and takes metoprolol and digoxin. recommend continuing these home medications.   3. Anemia: As anticipated from multiple procedures and surgery.  Currently no evidence of active bleeding.  Hemoglobin 8.1.  No indication for transfusion at this time.  4. Essential hypertension: controlled. Recommend continuing home regimen  5.Dementia present with depression: Awake alert. appears stable at baseline this am. Continue sertraline.  Ok for discharge from medicine perspective  Thank you for opportunity to participate in this patient's care.    DVT prophylaxis: Eliquis Code Status: Full code Family Communication: No family at bedside.  Orthopedics communicated with family. Disposition Plan: SNF when bed available.   Consultants:   Orthopedics  Procedures:    ORIF left femur  Antimicrobials:   None   Subjective: Patient seen and examined in the morning.  She was sleepy.  No other overnight events.  Denied any pain.  Objective: Vitals:   09/12/18 0435 09/12/18 0828 09/12/18 1248 09/13/18 0555  BP: 124/66 122/62 (!) 109/38 (!) 120/49  Pulse: 65 80 73 71  Resp: 17     Temp: 97.7 F (36.5 C)  97.8 F (36.6 C) 98.4 F (36.9 C)  TempSrc: Oral  Oral Oral  SpO2: 100% 99% 94%   Weight:      Height:        Intake/Output Summary (Last 24 hours) at 09/13/2018 1352 Last data filed at 09/13/2018 0500 Gross per 24 hour  Intake 1251.9 ml  Output 500 ml  Net 751.9 ml   Filed Weights   09/11/18 0903  Weight: 74.8 kg    Examination:  General exam: Appears calm and comfortable, sleepy. Respiratory system: Clear to auscultation. Respiratory effort normal. Cardiovascular system: S1 & S2 heard, RRR. No JVD, murmurs, rubs, gallops or clicks. No pedal edema. Gastrointestinal system: Abdomen is nondistended, soft and nontender. No organomegaly or masses felt. Normal bowel sounds heard. Central nervous system: Alert and oriented. No focal neurological deficits. Extremities: Symmetric 5 x 5 power. Left knee on immobilizer.  Distal neurovascular status intact. Skin: No rashes, lesions or ulcers Psychiatry: Judgement and insight appear normal. Mood & affect appropriate.     Data Reviewed: I have personally reviewed following labs and imaging studies  CBC: Recent Labs  Lab 09/11/18 1004 09/12/18 0114 09/13/18 0151  WBC 8.3 9.5 10.7*  HGB 11.6* 8.7* 8.1*  HCT 36.9 27.6* 25.5*  MCV 104.2* 103.8* 102.0*  PLT 391 310 283   Basic Metabolic  Panel: Recent Labs  Lab 09/11/18 1004  NA 139  K 4.0  CL 102  CO2 26  GLUCOSE 116*  BUN 14  CREATININE 0.97  CALCIUM 9.1   GFR: Estimated Creatinine Clearance: 42.3 mL/min (by C-G formula based on SCr of 0.97 mg/dL). Liver Function Tests: No results for input(s): AST, ALT, ALKPHOS, BILITOT,  PROT, ALBUMIN in the last 168 hours. No results for input(s): LIPASE, AMYLASE in the last 168 hours. No results for input(s): AMMONIA in the last 168 hours. Coagulation Profile: Recent Labs  Lab 09/11/18 1004  INR 1.2   Cardiac Enzymes: No results for input(s): CKTOTAL, CKMB, CKMBINDEX, TROPONINI in the last 168 hours. BNP (last 3 results) No results for input(s): PROBNP in the last 8760 hours. HbA1C: No results for input(s): HGBA1C in the last 72 hours. CBG: No results for input(s): GLUCAP in the last 168 hours. Lipid Profile: No results for input(s): CHOL, HDL, LDLCALC, TRIG, CHOLHDL, LDLDIRECT in the last 72 hours. Thyroid Function Tests: No results for input(s): TSH, T4TOTAL, FREET4, T3FREE, THYROIDAB in the last 72 hours. Anemia Panel: No results for input(s): VITAMINB12, FOLATE, FERRITIN, TIBC, IRON, RETICCTPCT in the last 72 hours. Sepsis Labs: No results for input(s): PROCALCITON, LATICACIDVEN in the last 168 hours.  Recent Results (from the past 240 hour(s))  MRSA PCR Screening     Status: None   Collection Time: 09/11/18 11:12 PM  Result Value Ref Range Status   MRSA by PCR NEGATIVE NEGATIVE Final    Comment:        The GeneXpert MRSA Assay (FDA approved for NASAL specimens only), is one component of a comprehensive MRSA colonization surveillance program. It is not intended to diagnose MRSA infection nor to guide or monitor treatment for MRSA infections. Performed at [facility], [address], [place], [place] [address]          Radiology Studies: Dg C-arm 1-60 Min  Result Date: 09/11/2018 CLINICAL DATA:  85 year old female with ORIF femur EXAM: DG C-ARM 61-120 MIN; LEFT FEMUR 2 VIEWS COMPARISON:  None. FINDINGS: Multiple intraoperative fluoroscopic spot images of left femur. Surgical changes of open reduction internal fixation with antegrade intramedullary rod and buttress plate and screw fixation. IMPRESSION: Limited intraoperative fluoroscopic  spot images of ORIF of left femur. Please refer to the dictated operative report for full details of intraoperative findings and procedure. Electronically Signed   By: Gilmer Mor D.O.   On: 09/11/2018 14:45   Dg Femur Min 2 Views Left  Result Date: 09/11/2018 CLINICAL DATA:  85 year old female with ORIF femur EXAM: DG C-ARM 61-120 MIN; LEFT FEMUR 2 VIEWS COMPARISON:  None. FINDINGS: Multiple intraoperative fluoroscopic spot images of left femur. Surgical changes of open reduction internal fixation with antegrade intramedullary rod and buttress plate and screw fixation. IMPRESSION: Limited intraoperative fluoroscopic spot images of ORIF of left femur. Please refer to the dictated operative report for full details of intraoperative findings and procedure. Electronically Signed   By: Gilmer Mor D.O.   On: 09/11/2018 14:45        Scheduled Meds: . apixaban  5 mg Oral BID  . aspirin EC  81 mg Oral Daily  . digoxin  0.125 mg Oral Daily  . fluticasone  1 spray Each Nare BID  . metoprolol succinate  100 mg Oral BID  . polyethylene glycol  17 g Oral Daily  . senna  1 tablet Oral QHS  . sertraline  50 mg Oral Daily   Continuous Infusions: .  methocarbamol (ROBAXIN) IV       LOS: 2 days    Time spent: 15 minutes    Dorcas Carrow, MD [facility] Pager [phone]  If 7PM-7AM, please contact night-coverage [email or web address] Password [email or web address] 09/13/2018, 1:52 PM

## 2018-09-13 NOTE — TOC Initial Note (Signed)
Transition of Care [facility]) - Initial/Assessment Note    Patient Details  Name: Veronica Flowers MRN: [id number] Date of Birth: 1928/04/23  Transition [facility]) CM/SW Contact:    Mamie Nick, LCSW Phone Number: 09/13/2018, 1:36 PM  Clinical Narrative:                  CSW contacted patients son and daughter in law to assist with discharge planning. Current recommendations by PT/OT are for short-term rehab. Patient's son and daughter agrees with recommendations and will assist as needed for discharge. Patient prior to admission was residing at [facility] for two weeks and had a fall. Patient had a rehab stint last [facility] at [facility]. Patient's son and daughter in law was informed of possible co-payments. Patient son and daughter acknowledged. Will fax referral and await potential bed offers to present to patient's family members.  Disposition pending bed offers for SNF rehab.    Expected Discharge Plan: Skilled Nursing Facility Barriers to Discharge: No Barriers Identified   Patient Goals and CMS Choice   CMS Medicare.gov Compare Post Acute Care list provided to:: Other (Comment Required)(Patient's son and daughter in law) Choice offered to / list presented to : Adult Children  Expected Discharge Plan and Services Expected Discharge Plan: Skilled Nursing Facility     Post Acute Care Choice: Skilled Nursing Facility Living arrangements for the past 2 months: (Was residing at home alone. Residing at [facility] prior to admission for two weeks. )                                      Prior Living Arrangements/Services Living arrangements for the past 2 months: (Was residing at home alone. Residing at [facility] prior to admission for two weeks. ) Lives with:: Facility Resident Patient language and need for interpreter reviewed:: No Do you feel safe going back to the place where you live?: No      Need for Family Participation in Patient Care: Yes  (Comment) Care giver support system in place?: Yes (comment) Current home services: Home PT, Home OT Criminal Activity/Legal Involvement Pertinent to Current Situation/Hospitalization: No - Comment as needed  Activities of Daily Living Home Assistive Devices/Equipment: Walker (specify type) ADL Screening (condition at time of admission) Patient's cognitive ability adequate to safely complete daily activities?: Yes Is the patient deaf or have difficulty hearing?: Yes Does the patient have difficulty seeing, even when wearing glasses/contacts?: No Does the patient have difficulty concentrating, remembering, or making decisions?: No Patient able to express need for assistance with ADLs?: Yes Does the patient have difficulty dressing or bathing?: No Independently performs ADLs?: Yes (appropriate for developmental age) Does the patient have difficulty walking or climbing stairs?: Yes Weakness of Legs: Left Weakness of Arms/Hands: None  Permission Sought/Granted Permission sought to share information with : Family Supports Permission granted to share information with : Yes, Verbal Permission Granted        Permission granted to share info w Relationship: Son [phone] Veronica Flowers     Emotional Assessment     Affect (typically observed): Unable to Assess Orientation: : Oriented to Self Alcohol / Substance Use: Not Applicable Psych Involvement: No (comment)  Admission diagnosis:  LEFT DISTAL FEMUR FRACTURE Patient Active Problem List   Diagnosis Date Noted  . Closed displaced supracondylar fracture of distal end of left femur without intracondylar extension (HCC) 09/11/2018  . Unspecified atrial fibrillation (  HCC) 09/11/2018  . Chronic anticoagulation 09/11/2018  . Essential hypertension 09/11/2018  . Dementia, presenile with depression (HCC) 09/11/2018  . Displaced supracondylar fracture without intracondylar extension of lower end of left femur, initial encounter for closed  fracture (HCC) 09/08/2018   PCP:  System, Pcp Not In Pharmacy:  No Pharmacies Listed    Social Determinants of Health (SDOH) Interventions    Readmission Risk Interventions No flowsheet data found.

## 2018-09-13 NOTE — NC FL2 (Signed)
[place] MEDICAID FL2 LEVEL OF CARE SCREENING TOOL     IDENTIFICATION  Patient Name: Veronica Flowers Birthdate: 02/22/1928 Sex: female Admission Date (Current Location): 09/11/2018  [facility] [place] and [place] Number:  Producer, television/film/video and Address:  [facility] [place] [place]. [facility], [address] [place], [place] [address]      Provider Number:    Attending Physician Name and Address:  Sheral Apley, MD  Relative Name and Phone Number:  Veronica Flowers [phone]    Current Level of Care: Hospital Recommended Level of Care: Skilled Nursing Facility Prior Approval Number:    Date Approved/Denied:   PASRR Number: [id number]  Discharge Plan: SNF    Current Diagnoses: Patient Active Problem List   Diagnosis Date Noted  . Closed displaced supracondylar fracture of distal end of left femur without intracondylar extension (HCC) 09/11/2018  . Unspecified atrial fibrillation (HCC) 09/11/2018  . Chronic anticoagulation 09/11/2018  . Essential hypertension 09/11/2018  . Dementia, presenile with depression (HCC) 09/11/2018  . Displaced supracondylar fracture without intracondylar extension of lower end of left femur, initial encounter for closed fracture (HCC) 09/08/2018    Orientation RESPIRATION BLADDER Height & Weight     Self  Normal Incontinent, External catheter Weight: 165 lb (74.8 kg) Height:  5\' 7"  (170.2 cm)  BEHAVIORAL SYMPTOMS/MOOD NEUROLOGICAL BOWEL NUTRITION STATUS      Continent (Heart diet thin fluids)  AMBULATORY STATUS COMMUNICATION OF NEEDS Skin   Limited Assist Verbally (Incision closed left leg)                       Personal Care Assistance Level of Assistance  Bathing, Feeding, Dressing Bathing Assistance: Limited assistance Feeding assistance: Limited assistance Dressing Assistance: Limited assistance     Functional Limitations Info  Sight, Hearing, Speech Sight Info: Adequate Hearing Info: Impaired Speech Info: Adequate     SPECIAL CARE FACTORS FREQUENCY  PT (By licensed PT), OT (By licensed OT)     PT Frequency: 3x OT Frequency: 3x            Contractures Contractures Info: Not present    Additional Factors Info  Code Status(Full code) Code Status Info: Full Code             Current Medications (09/13/2018):  This is the current hospital active medication list Current Facility-Administered Medications  Medication Dose Route Frequency Provider Last Rate Last Dose  . acetaminophen (TYLENOL) tablet 325-650 mg  325-650 mg Oral Q6H PRN Albina Billet III, PA-C   650 mg at 09/12/18 2110  . apixaban (ELIQUIS) tablet 5 mg  5 mg Oral BID Albina Billet III, PA-C   5 mg at 09/13/18 9030  . aspirin EC tablet 81 mg  81 mg Oral Daily Albina Billet III, PA-C   81 mg at 09/13/18 0923  . digoxin (LANOXIN) tablet 0.125 mg  0.125 mg Oral Daily Albina Billet III, PA-C   0.125 mg at 09/13/18 3007  . fluticasone (FLONASE) 50 MCG/ACT nasal spray 1 spray  1 spray Each Nare BID Albina Billet III, PA-C   1 spray at 09/13/18 6226  . HYDROcodone-acetaminophen (NORCO/VICODIN) 5-325 MG per tablet 1-2 tablet  1-2 tablet Oral Q6H PRN Albina Billet III, PA-C   1 tablet at 09/12/18 0511  . HYDROmorphone (DILAUDID) injection 0.25 mg  0.25 mg Intravenous Q4H PRN Albina Billet III, PA-C      . methocarbamol (ROBAXIN) tablet 500 mg  500 mg  Oral Q6H PRN Albina BilletMartensen, Henry Calvin III, PA-C   500 mg at 09/12/18 0830   Or  . methocarbamol (ROBAXIN) 500 mg in dextrose 5 % 50 mL IVPB  500 mg Intravenous Q6H PRN Albina BilletMartensen, Henry Calvin III, PA-C      . metoCLOPramide (REGLAN) tablet 5-10 mg  5-10 mg Oral Q8H PRN Albina BilletMartensen, Henry Calvin III, PA-C       Or  . metoCLOPramide (REGLAN) injection 5-10 mg  5-10 mg Intravenous Q8H PRN Albina BilletMartensen, Henry Calvin III, PA-C      . metoprolol succinate (TOPROL-XL) 24 hr tablet 100 mg  100 mg Oral BID Albina BilletMartensen, Henry Calvin III, PA-C    100 mg at 09/13/18 [id number]  . ondansetron (ZOFRAN) tablet 4 mg  4 mg Oral Q6H PRN Albina BilletMartensen, Henry Calvin III, PA-C       Or  . ondansetron [facility](ZOFRAN) injection 4 mg  4 mg Intravenous Q6H PRN Albina BilletMartensen, Henry Calvin III, PA-C      . polyethylene glycol (MIRALAX / GLYCOLAX) packet 17 g  17 g Oral Daily Albina BilletMartensen, Henry Calvin III, PA-C   17 g at 09/13/18 [id number]  . polyethylene glycol (MIRALAX / GLYCOLAX) packet 17 g  17 g Oral Daily PRN Albina BilletMartensen, Henry Calvin III, PA-C      . senna (SENOKOT) tablet 8.6 mg  1 tablet Oral QHS Albina BilletMartensen, Henry Calvin III, PA-C   8.6 mg at 09/12/18 2110  . sertraline (ZOLOFT) tablet 50 mg  50 mg Oral Daily Albina BilletMartensen, Henry Calvin III, PA-C   50 mg at 09/13/18 [id number]  . traMADol (ULTRAM) tablet 25-50 mg  25-50 mg Oral Q6H PRN Albina BilletMartensen, Henry Calvin III, PA-C   25 mg at 09/12/18 0830     Discharge Medications: Please see discharge summary for a list of discharge medications.  Relevant Imaging Results:  Relevant Lab Results:   Additional Information SN:[id number]  [facility] Health[facility] D[facility] Glenbrook HospitalMahamadu Naziah Weckerly, LCSW

## 2018-09-13 NOTE — Progress Notes (Signed)
Talked to SW about pt and if need to order Covid test for placement to SNF.  Will order test so if need for placement it will not cause a delay.

## 2018-09-14 LAB — NOVEL CORONAVIRUS, NAA (HOSP ORDER, SEND-OUT TO REF LAB; TAT 18-24 HRS): SARS-CoV-2, NAA: NOT DETECTED

## 2018-09-14 MED ORDER — ENSURE ENLIVE PO LIQD
237.0000 mL | Freq: Two times a day (BID) | ORAL | Status: DC
  Administered 2018-09-14: 237 mL via ORAL

## 2018-09-14 NOTE — Progress Notes (Addendum)
Patient ID: Veronica Flowers, female   DOB: [birth year], 85 y.o.   MRN: [id number]     Subjective:  Patient reports pain as mild.  Patient in bed and in no acute distress.  Denies any CP or SOB  Objective:   VITALS:   Vitals:   09/13/18 0555 09/13/18 1421 09/13/18 1950 09/14/18 0451  BP: (!) 120/49 127/65 (!) 121/57 131/65  Pulse: 71 72 66 65  Resp:   17 16  Temp: 98.4 F (36.9 C) 98.3 F (36.8 C) 98.7 F (37.1 C) 98.2 F (36.8 C)  TempSrc: Oral Oral Oral Oral  SpO2:  96% 97% 96%  Weight:      Height:        ABD soft Sensation intact distally Dorsiflexion/Plantar flexion intact Incision: dressing C/D/I and no drainage Dressing changed wound good  Lab Results  Component Value Date   WBC 10.7 (H) 09/13/2018   HGB 8.1 (L) 09/13/2018   HCT 25.5 (L) 09/13/2018   MCV 102.0 (H) 09/13/2018   PLT 283 09/13/2018   BMET    Component Value Date/Time   NA 139 09/11/2018 1004   K 4.0 09/11/2018 1004   CL 102 09/11/2018 1004   CO2 26 09/11/2018 1004   GLUCOSE 116 (H) 09/11/2018 1004   BUN 14 09/11/2018 1004   CREATININE 0.97 09/11/2018 1004   CALCIUM 9.1 09/11/2018 1004   GFRNONAA 52 (L) 09/11/2018 1004   GFRAA >60 09/11/2018 1004     Assessment/Plan: 3 Days Post-Op   Principal Problem:   Displaced supracondylar fracture without intracondylar extension of lower end of left femur, initial encounter for closed fracture (HCC) Active Problems:   Closed displaced supracondylar fracture of distal end of left femur without intracondylar extension (HCC)   Unspecified atrial fibrillation (HCC)   Chronic anticoagulation   Essential hypertension   Dementia, presenile with depression (HCC)   Advance diet Up with therapy Discharge to SNF when bed available TTWB left lower ext Dry dressing PRN   Nelda Severe 09/14/2018, 12:13 PM  Discussed, agree with above.  ABLA: observe. Teryl Lucy, MD Cell [phone]

## 2018-09-14 NOTE — Progress Notes (Signed)
Spoke to [place], son, via phone and gave him update on pt condition.  Pt has Medicare and [facility] plan, the basic coverage.  Pt had been at [facility] from prior injury and then was most recently at [facility].   Reviewed diet preferences with family so can order what she likes to eat.  Will order vanilla Ensure for pt.  Her appetite is very poor and family states that she only nibbles on things even PTA.

## 2018-09-14 NOTE — Progress Notes (Signed)
PROGRESS NOTE    Veronica Flowers  OHF:[id number] DOB: Sep 16, 1927 DOA: 09/11/2018 PCP: System, Pcp Not In    Brief Narrative:  85 year old female with recent femoral shaft fracture status post ORIF admitted after fall and found to have periprosthetic fracture.   Assessment & Plan:   Principal Problem:   Displaced supracondylar fracture without intracondylar extension of lower end of left femur, initial encounter for closed fracture [facility]) Active Problems:   Closed displaced supracondylar fracture of distal end of left femur without intracondylar extension (HCC)   Unspecified atrial fibrillation (HCC)   Chronic anticoagulation   Essential hypertension   Dementia, presenile with depression (HCC)  1. Displaced subcondylar fracture without intercondylar extension of lower end of left femur and closed displaced supracondylar fracture of distal end of left femur:  Status post ORIF. Day 3 postop today.  Enough pain medications.  PT OT.  Is stable to transfer to skilled nursing facility once available.  2.  Chronic atrial fibrillation: rate controlled.  Patient remains anticoagulated with Eliquis and takes metoprolol and digoxin. recommend continuing these home medications.   3. Anemia: As anticipated from multiple procedures and surgery.  Currently no evidence of active bleeding.  Hemoglobin 8.1.  No indication for transfusion at this time.  4. Essential hypertension: controlled. Recommend continuing home regimen  5.Dementia present with depression: Awake alert. appears stable at baseline this am. Continue sertraline.  Ok for discharge from medicine perspective  Thank you for opportunity to participate in this patient's care.    DVT prophylaxis: Eliquis Code Status: Full code Family Communication: No family at bedside.  Orthopedics communicated with family. Disposition Plan: SNF when bed available.   Consultants:   Orthopedics  Procedures:   ORIF left femur   Antimicrobials:   None   Subjective: Patient seen and examined in the morning.  No new complaints.  Denies any pain.  Objective: Vitals:   09/13/18 0555 09/13/18 1421 09/13/18 1950 09/14/18 0451  BP: (!) 120/49 127/65 (!) 121/57 131/65  Pulse: 71 72 66 65  Resp:   17 16  Temp: 98.4 F (36.9 C) 98.3 F (36.8 C) 98.7 F (37.1 C) 98.2 F (36.8 C)  TempSrc: Oral Oral Oral Oral  SpO2:  96% 97% 96%  Weight:      Height:        Intake/Output Summary (Last 24 hours) at 09/14/2018 1229 Last data filed at 09/14/2018 0900 Gross per 24 hour  Intake 400 ml  Output 751 ml  Net -351 ml   Filed Weights   09/11/18 0903  Weight: 74.8 kg    Examination:  General exam: Appears calm and comfortable, sleepy. Respiratory system: Clear to auscultation. Respiratory effort normal. Cardiovascular system: S1 & S2 heard, RRR. No JVD, murmurs, rubs, gallops or clicks. No pedal edema. Gastrointestinal system: Abdomen is nondistended, soft and nontender. No organomegaly or masses felt. Normal bowel sounds heard. Central nervous system: Alert and oriented. No focal neurological deficits. Extremities: Symmetric 5 x 5 power. Left knee on immobilizer.  Distal neurovascular status intact. Skin: No rashes, lesions or ulcers Psychiatry: Judgement and insight appear normal. Mood & affect appropriate.     Data Reviewed: I have personally reviewed following labs and imaging studies  CBC: Recent Labs  Lab 09/11/18 1004 09/12/18 0114 09/13/18 0151  WBC 8.3 9.5 10.7*  HGB 11.6* 8.7* 8.1*  HCT 36.9 27.6* 25.5*  MCV 104.2* 103.8* 102.0*  PLT 391 310 283   Basic Metabolic Panel: Recent Labs  Lab 09/11/18 1004  NA 139  K 4.0  CL 102  CO2 26  GLUCOSE 116*  BUN 14  CREATININE 0.97  CALCIUM 9.1   GFR: Estimated Creatinine Clearance: 42.3 mL/min (by C-G formula based on SCr of 0.97 mg/dL). Liver Function Tests: No results for input(s): AST, ALT, ALKPHOS, BILITOT, PROT, ALBUMIN in the last  168 hours. No results for input(s): LIPASE, AMYLASE in the last 168 hours. No results for input(s): AMMONIA in the last 168 hours. Coagulation Profile: Recent Labs  Lab 09/11/18 1004  INR 1.2   Cardiac Enzymes: No results for input(s): CKTOTAL, CKMB, CKMBINDEX, TROPONINI in the last 168 hours. BNP (last 3 results) No results for input(s): PROBNP in the last 8760 hours. HbA1C: No results for input(s): HGBA1C in the last 72 hours. CBG: No results for input(s): GLUCAP in the last 168 hours. Lipid Profile: No results for input(s): CHOL, HDL, LDLCALC, TRIG, CHOLHDL, LDLDIRECT in the last 72 hours. Thyroid Function Tests: No results for input(s): TSH, T4TOTAL, FREET4, T3FREE, THYROIDAB in the last 72 hours. Anemia Panel: No results for input(s): VITAMINB12, FOLATE, FERRITIN, TIBC, IRON, RETICCTPCT in the last 72 hours. Sepsis Labs: No results for input(s): PROCALCITON, LATICACIDVEN in the last 168 hours.  Recent Results (from the past 240 hour(s))  MRSA PCR Screening     Status: None   Collection Time: 09/11/18 11:12 PM  Result Value Ref Range Status   MRSA by PCR NEGATIVE NEGATIVE Final    Comment:        The GeneXpert MRSA Assay (FDA approved for NASAL specimens only), is one component of a comprehensive MRSA colonization surveillance program. It is not intended to diagnose MRSA infection nor to guide or monitor treatment for MRSA infections. Performed at [facility], [address] [place], [place] [address]          Radiology Studies: No results found.      Scheduled Meds: . apixaban  5 mg Oral BID  . aspirin EC  81 mg Oral Daily  . digoxin  0.125 mg Oral Daily  . feeding supplement (ENSURE ENLIVE)  237 mL Oral BID BM  . fluticasone  1 spray Each Nare BID  . metoprolol succinate  100 mg Oral BID  . polyethylene glycol  17 g Oral Daily  . senna  1 tablet Oral QHS  . sertraline  50 mg Oral Daily   Continuous Infusions: . methocarbamol (ROBAXIN)  IV       LOS: 3 days    Time spent: 15 minutes    Dorcas CarrowKuber Rahm Minix, MD [facility] Pager [phone]  If 7PM-7AM, please contact night-coverage [email or web address] Password [email or web address] 09/14/2018, 12:29 PM

## 2018-09-14 NOTE — Progress Notes (Signed)
Noted Coronavirus test back none detected.

## 2018-09-15 DIAGNOSIS — I4891 Unspecified atrial fibrillation: Secondary | ICD-10-CM

## 2018-09-15 DIAGNOSIS — Z7901 Long term (current) use of anticoagulants: Secondary | ICD-10-CM

## 2018-09-15 DIAGNOSIS — I10 Essential (primary) hypertension: Secondary | ICD-10-CM

## 2018-09-15 MED ORDER — ACETAMINOPHEN ER 650 MG PO TBCR: 650.0000 mg | EXTENDED_RELEASE_TABLET | Freq: Four times a day (QID) | ORAL | 0 refills | Status: DC | PRN

## 2018-09-15 NOTE — Discharge Summary (Addendum)
Discharge Summary  Patient ID: Veronica Flowers MRN: [id number] DOB/AGE: Nov 24, 1927 85 y.o.  Admit date: 09/11/2018 Discharge date: 09/15/2018  Admission Diagnoses:  Displaced supracondylar fracture without intracondylar extension of lower end of left femur, initial encounter for closed fracture [facility])  Discharge Diagnoses:  Principal Problem:   Displaced supracondylar fracture without intracondylar extension of lower end of left femur, initial encounter for closed fracture [facility]) Active Problems:   Closed displaced supracondylar fracture of distal end of left femur without intracondylar extension (HCC)   Unspecified atrial fibrillation (HCC)   Chronic anticoagulation   Essential hypertension   Dementia, presenile with depression (HCC)   Past Medical History:  Diagnosis Date  . A-fib (HCC)   . Falls   . Femur fracture, left (HCC)    supracondylar  . Hypertension   . Major depressive disorder   . Nausea   . Seasonal allergies     Surgeries: Procedure(s): OPEN REDUCTION INTERNAL FIXATION (ORIF) DISTAL FEMUR FRACTURE on 09/11/2018   Consultants (if any):   Discharged Condition: Improved  History: The patient had IM nail of her intertrochanteric fracture performed by Luiz Iron, MD at [facility] on 07-15-18. She had initially done well. After this she was discharged to [facility] in [place]. She has had a few falls while there, one on 08-21-18 and one after. She has worsened in her ability to walk with pain around her thigh after these falls. She has been on Eliquis and aspirin at [facility]. She has a history of Afib.  X-rays were taken on 09/08/2018 follow-up with Dr. Eulah Pont.  She unfortunately has suffered a fracture of her supracondylar femur at the tip of the nail.  She underwent ORIF 09/11/2018.  Hospital Course: Maryana Pittmon is an 85 y.o. female who was admitted 09/11/2018 with a diagnosis of Displaced supracondylar fracture without intracondylar extension of lower end of left  femur, initial encounter for closed fracture (HCC) and went to the operating room on 09/11/2018 and underwent the above named procedures.    She was given perioperative antibiotics:  Anti-infectives (From admission, onward)   Start     Dose/Rate Route Frequency Ordered Stop   09/11/18 1830  ceFAZolin (ANCEF) IVPB 2g/100 mL premix     2 g 200 mL/hr over 30 Minutes Intravenous Every 6 hours 09/11/18 1552 09/12/18 0552   09/11/18 0845  ceFAZolin (ANCEF) IVPB 2g/100 mL premix     2 g 200 mL/hr over 30 Minutes Intravenous On call to O.R. 09/11/18 1324 09/11/18 1301    .  She was given sequential compression devices, early ambulation, and Eliquis for DVT prophylaxis.  She benefited maximally from the hospital stay and there were no complications.  She was evaluated by the Medicine service for her chronic conditions which remained stable.  Chronic conditions stable.  Novel Coronavirus, NAA test was NEGATIVE.  She was cleared for discharge from a medical perspective.  Recent vital signs:  Vitals:   09/14/18 2008 09/15/18 0446  BP: (!) 107/49 (!) 124/59  Pulse: 60 84  Resp: 14 14  Temp: 98.2 F (36.8 C) 98.4 F (36.9 C)  SpO2: 96% 96%    Recent laboratory studies:  Lab Results  Component Value Date   HGB 8.1 (L) 09/13/2018   HGB 8.7 (L) 09/12/2018   HGB 11.6 (L) 09/11/2018   Lab Results  Component Value Date   WBC 10.7 (H) 09/13/2018   PLT 283 09/13/2018   Lab Results  Component Value Date   INR 1.2 09/11/2018   Lab  Results  Component Value Date   NA 139 09/11/2018   K 4.0 09/11/2018   CL 102 09/11/2018   CO2 26 09/11/2018   BUN 14 09/11/2018   CREATININE 0.97 09/11/2018   GLUCOSE 116 (H) 09/11/2018    Discharge Medications:   Allergies as of 09/15/2018   No Known Allergies     Medication List    STOP taking these medications   HYDROcodone-acetaminophen 5-325 MG tablet Commonly known as:  NORCO/VICODIN   traMADol 50 MG tablet Commonly known as:  ULTRAM      TAKE these medications   acetaminophen 650 MG CR tablet Commonly known as:  TYLENOL Take 1 tablet (650 mg total) by mouth every 6 (six) hours as needed for pain. What changed:    how much to take  when to take this  reasons to take this   aspirin EC 81 MG tablet Take 81 mg by mouth daily.   Biofreeze 4 % Gel Generic drug:  Menthol (Topical Analgesic) Apply 1 application topically 3 (three) times daily. Apply to left hip,knee, and thigh   CENTRUM PO Take 1 tablet by mouth daily.   cholecalciferol 25 MCG (1000 UT) tablet Commonly known as:  VITAMIN D3 Take 1,000 Units by mouth daily.   digoxin 0.125 MG tablet Commonly known as:  LANOXIN Take 0.125 mg by mouth daily.   Eliquis 5 MG Tabs tablet Generic drug:  apixaban Take 5 mg by mouth 2 (two) times daily.   fluticasone 50 MCG/ACT nasal spray Commonly known as:  FLONASE Place 1 spray into both nostrils 2 (two) times a day.   loratadine 10 MG tablet Commonly known as:  CLARITIN Take 10 mg by mouth at bedtime.   Melatonin 3 MG Caps Take 3 mg by mouth at bedtime.   metoprolol succinate 100 MG 24 hr tablet Commonly known as:  TOPROL-XL Take 100 mg by mouth 2 (two) times a day.   polyethylene glycol 17 g packet Commonly known as:  MIRALAX / GLYCOLAX Take 17 g by mouth daily.   senna 8.6 MG Tabs tablet Commonly known as:  SENOKOT Take 1 tablet by mouth at bedtime.   sertraline 50 MG tablet Commonly known as:  ZOLOFT Take 50 mg by mouth daily.       Diagnostic Studies: Dg C-arm 1-60 Min  Result Date: 09/11/2018 CLINICAL DATA:  85 year old female with ORIF femur EXAM: DG C-ARM 61-120 MIN; LEFT FEMUR 2 VIEWS COMPARISON:  None. FINDINGS: Multiple intraoperative fluoroscopic spot images of left femur. Surgical changes of open reduction internal fixation with antegrade intramedullary rod and buttress plate and screw fixation. IMPRESSION: Limited intraoperative fluoroscopic spot images of ORIF of left femur. Please  refer to the dictated operative report for full details of intraoperative findings and procedure. Electronically Signed   By: Gilmer MorJaime  Wagner D.O.   On: 09/11/2018 14:45   Dg Femur Min 2 Views Left  Result Date: 09/11/2018 CLINICAL DATA:  85 year old female with ORIF femur EXAM: DG C-ARM 61-120 MIN; LEFT FEMUR 2 VIEWS COMPARISON:  None. FINDINGS: Multiple intraoperative fluoroscopic spot images of left femur. Surgical changes of open reduction internal fixation with antegrade intramedullary rod and buttress plate and screw fixation. IMPRESSION: Limited intraoperative fluoroscopic spot images of ORIF of left femur. Please refer to the dictated operative report for full details of intraoperative findings and procedure. Electronically Signed   By: Gilmer MorJaime  Wagner D.O.   On: 09/11/2018 14:45    Disposition: Discharge disposition: 03-Skilled Nursing Facility  Discharge Instructions    Discharge patient   Complete by:  As directed    Discharge disposition:  03-Skilled Nursing Facility   Discharge patient date:  09/15/2018       Contact information for follow-up providers    Sheral Apley, MD.   Specialty:  Orthopedic Surgery Contact information: [address] [place] [address] [phone]            Contact information for after-discharge care    Destination    [facility] .   Service:  Skilled Nursing Contact information: [address] [place] [address] [phone]                   Signed: Albina Billet III PA-C 09/15/2018, 2:11 PM

## 2018-09-15 NOTE — Op Note (Signed)
09/11/2018  8:31 AM  PATIENT:  Veronica Flowers    PRE-OPERATIVE DIAGNOSIS:  LEFT DISTAL FEMUR FRACTURE  POST-OPERATIVE DIAGNOSIS:  Same  PROCEDURE:  OPEN REDUCTION INTERNAL FIXATION (ORIF) DISTAL FEMUR FRACTURE  SURGEON:  Sheral Apley, MD  ASSISTANT: gen  ANESTHESIA:   Aquilla Hacker, PA-C, he was present and scrubbed throughout the case, critical for completion in a timely fashion, and for retraction, instrumentation, and closure.   PREOPERATIVE INDICATIONS:  Veronica Flowers is a  85 y.o. female with a diagnosis of LEFT DISTAL FEMUR FRACTURE who failed conservative measures and elected for surgical management.    The risks benefits and alternatives were discussed with the patient preoperatively including but not limited to the risks of infection, bleeding, nerve injury, cardiopulmonary complications, the need for revision surgery, among others, and the patient was willing to proceed.  OPERATIVE IMPLANTS: biomet lateral locking plate  OPERATIVE FINDINGS: unstable fracture  BLOOD LOSS: 250  COMPLICATIONS: none  TOURNIQUET TIME: none  OPERATIVE PROCEDURE:  Patient was identified in the preoperative holding area and site was marked by me She was transported to the operating theater and placed on the table in supine position taking care to pad all bony prominences. After a preincinduction time out anesthesia was induced. The left lower extremity was prepped and draped in normal sterile fashion and a pre-incision timeout was performed. She received ancef for preoperative antibiotics.   She was placed in the lateral position extra was placed all bony prominences were padded  The left lower extremity was prepped and draped in normal standard fashion.  I made a lateral incision over her distal femur dissected to her IT band I then incised this longitudinally.  Identified her fracture site it was significantly shortened with the nail penetrating the anterior cortex.  I was able to  mobilize this using a Cobb I then was able to pull some length back and reduce the nail back intramedullary and achieve an anatomic alignment of the fracture I clamped this in the place.  Next I selected an NCB plate and pinned this into place on the lateral femur I took multiple x-rays and was happy with its alignment I then placed distal locking screws followed by proximal cortical screw and a wire around the fracture site fixed to the plate as well.  I placed the remaining proximal cortical screws and distal locking screws I was happy with the fixation of all screws.  I took for x-rays of her knee and distal femur reviewed these myself and was happy with the alignment and placement of all hardware her incision was then thoroughly irrigated and closed in layers  POST OPERATIVE PLAN: TDWB, start early ROM, chemical dvt px

## 2018-09-15 NOTE — Progress Notes (Signed)
Report given to Thurston Hole, Charity fundraiser at [facility].

## 2018-09-15 NOTE — TOC Transition Note (Signed)
Transition of Care [facility]) - CM/SW Discharge Note   Patient Details  Name: Veronica Flowers MRN: [id number] Date of Birth: [birth year]  Transition of [facility]) CM/SW Contact:  Doy Hutching, LCSWA Phone Number: 09/15/2018, 3:02 PM   Clinical Narrative:    CSW arranged bed at [facility], clarified prescription needs with PA and updated summary sent to [facility]. Pt family aware and amenable to transfer to [facility] today. [facility] called for 3pm. All questions by family answered and assisted pt with answering telephone to speak with family.    Final next level of care: Skilled Nursing Facility Barriers to Discharge: Barriers Resolved   Patient Goals and CMS Choice Patient states their goals for this hospitalization and ongoing recovery are:: for her to get rehab- pt son CMS Medicare.gov Compare Post Acute Care list provided to:: Patient Represenative (must comment) Choice offered to / list presented to : Adult Children  Discharge Placement   Existing PASRR number confirmed : 09/13/18          Patient chooses bed at: [facility] Patient to be transferred to facility by: [facility] Name of family member notified: pt son and daughter in law Harvie Heck and [place] Patient and family notified of of transfer: 09/15/18  Discharge Plan and Services     Post Acute Care Choice: Skilled Nursing Facility                               Social Determinants of Health (SDOH) Interventions     Readmission Risk Interventions No flowsheet data found.

## 2018-09-15 NOTE — Social Work (Signed)
Clinical Social Worker facilitated patient discharge including contacting patient family and facility to confirm patient discharge plans.  Clinical information faxed to facility and family agreeable with plan.  CSW arranged ambulance transport via [facility] to [facility]. 3pm pick up RN to call [phone]  with report prior to discharge.  Clinical Social Worker will sign off for now as social work intervention is no longer needed. Please consult [place] again if new need arises.  Octavio Graves, MSW, [facility] Clinical Social Worker [phone]

## 2018-09-15 NOTE — Progress Notes (Signed)
Transported by [facility].

## 2018-09-15 NOTE — TOC Progression Note (Addendum)
Transition of Care [facility]) - Progression Note    Patient Details  Name: Veronica Flowers MRN: [id number] Date of Birth: 04-12-1928  Transition of Care [facility]) CM/SW Contact  Doy Hutching, [place] Phone Number: 09/15/2018, 10:15 AM  Clinical Narrative:    11:17am- Pt family has decided on [facility] for placement, I have updated Molli Hazard in admissions of pt preference. Await update for auth.   10:14am- CSW spoke with pt son Harvie Heck and daughter in law Eber Jones, they are aware pt medically cleared per MD/PA for d/c to SNF. They had not been sent choices and requested that list be emailed to [email or web address].  Email with offers sent, following for choice.    Expected Discharge Plan: Skilled Nursing Facility Barriers to Discharge: No Barriers Identified  Expected Discharge Plan and Services Expected Discharge Plan: Skilled Nursing Facility     Post Acute Care Choice: Skilled Nursing Facility Living arrangements for the past 2 months: (Was residing at home alone. Residing at [facility] prior to admission for two weeks. ) Expected Discharge Date: 09/15/18                     Social Determinants of Health (SDOH) Interventions    Readmission Risk Interventions No flowsheet data found.

## 2018-09-15 NOTE — Care Management Important Message (Signed)
Important Message  Patient Details  Name: Mayona Yount MRN: [id number] Date of Birth: 1927/10/02   Medicare Important Message Given:  Yes    Azuree Minish Stefan Church 09/15/2018, 4:03 PM

## 2018-09-15 NOTE — Progress Notes (Signed)
PIV consult: discussed with Christel Mormon, RN: plan is for discharge. Cancel consult.

## 2018-09-15 NOTE — Progress Notes (Addendum)
History: The patient had IM nail of her intertrochanteric fracture performed by Luiz Ironlint McNabb, MD at [facility] on 07-15-18.  She had initially done well. After this she was discharged to [facility] in [place]. She has had a few falls while there, one on 08-21-18 and one after. She has worsened in her ability to walk with pain around her thigh after these falls. She has been on Eliquis and aspirin at [facility]. She has a history of Afib.  X-rays were taken on 09/08/2018 follow-up with Dr. Eulah PontMurphy.  She unfortunately has suffered a fracture of her supracondylar femur at the tip of the nail.  She underwent ORIF 09/11/2018.  Subjective: Patient reports pain as mild, controlled.  Tolerating some diet.   No CP, SOB.    Objective:   VITALS:   Vitals:   09/14/18 0451 09/14/18 1531 09/14/18 2008 09/15/18 0446  BP: 131/65 (!) 136/55 (!) 107/49 (!) 124/59  Pulse: 65 60 60 84  Resp: 16 18 14 14   Temp: 98.2 F (36.8 C) 98.2 F (36.8 C) 98.2 F (36.8 C) 98.4 F (36.9 C)  TempSrc: Oral Oral Oral Oral  SpO2: 96% 99% 96% 96%  Weight:      Height:       CBC Latest Ref Rng & Units 09/13/2018 09/12/2018 09/11/2018  WBC 4.0 - 10.5 K/uL 10.7(H) 9.5 8.3  Hemoglobin 12.0 - 15.0 g/dL 8.1(L) 8.7(L) 11.6(L)  Hematocrit 36.0 - 46.0 % 25.5(L) 27.6(L) 36.9  Platelets 150 - 400 K/uL 283 310 391   BMP Latest Ref Rng & Units 09/11/2018  Glucose 70 - 99 mg/dL 161(W116(H)  BUN 8 - 23 mg/dL 14  Creatinine 9.600.44 - 4.541.00 mg/dL 0.980.97  Sodium 119135 - 147145 mmol/L 139  Potassium 3.5 - 5.1 mmol/L 4.0  Chloride 98 - 111 mmol/L 102  CO2 22 - 32 mmol/L 26  Calcium 8.9 - 10.3 mg/dL 9.1   Intake/Output      05/03 0701 - 05/04 0700 05/04 0701 - 05/05 0700   P.O. 240    Total Intake(mL/kg) 240 (3.2)    Urine (mL/kg/hr) 425 (0.2)    Stool 0    Total Output 425    Net -185         Urine Occurrence 1 x    Stool Occurrence 1 x       Physical Exam: General: NAD.  Supine in bed.  Calm, conversant. Resp: No increased wob Cardio:  regular rate ABD soft Neurologically intact MSK LLE: Feet warm.  Dressings in place, CDI. EHL, FHL, dorsiflexion, plantarflexion intact.  Sensation intact distally.  Assessment: 4 Days Post-Op  S/P Procedure(s) (LRB): OPEN REDUCTION INTERNAL FIXATION (ORIF) DISTAL FEMUR FRACTURE (Left) by Dr. Jewel Baizeimothy D. Eulah PontMurphy on 09/11/2018  Principal Problem:   Displaced supracondylar fracture without intracondylar extension of lower end of left femur, initial encounter for closed fracture [facility](HCC) Active Problems:   Closed displaced supracondylar fracture of distal end of left femur without intracondylar extension (HCC)   Unspecified atrial fibrillation (HCC)   Chronic anticoagulation   Essential hypertension   Dementia, presenile with depression (HCC)  ABLA:  No sign of active bleeding.  Asymptomatic.    Closed displaced supracondylar femur fracture after IM nail Status post ORIF left distal femur. Pain controlled. SNF recommended by therapy team. Stable for discharge.  Plan: Incentive Spirometry Elevate and Apply ice Chronic conditions stable.  Novel Coronavirus, NAA test NEGATIVE.  Cleared for discharge from a medical perspective.  Weightbearing: PWB 50 % LLE.  Okay  for knee range of motion Insicional and dressing care: Dressings left intact until follow-up.  Ace wraps from foot to thigh prn for swelling. Orthopedic device(s): Knee immobilizer for comfort when mobilizing. Showering: Keep dressing dry VTE prophylaxis: Resume Eliquis and 81 mg aspirin, SCDs, ambulation Pain control: Tylenol for mild pain.  Tramadol for moderate pain.  Norco for severe pain.   Follow - up plan: 1 to 2 weeks in the office with Dr. Eulah Pont.  Dispo: Discharge back to SNF.  Previously at [facility].   Anticipated LOS equal to or greater than 2 midnights due to - Age 55 and older with one or more of the following:  - Obesity  - Expected need for hospital services (PT, OT, Nursing) required for safe   discharge  - Anticipated need for postoperative skilled nursing care or inpatient rehab  - Active co-morbidities: Cardiac Arrhythmia   Albina Billet III, PA-C 09/15/2018, 7:48 AM

## 2018-09-15 NOTE — Progress Notes (Signed)
  Patient with recent femoral shaft fracture s/p ORIF admitted by ortho with [facility] consulting regarding chronic afib, chronic anemia and htn.    Patient seen and examined and medically stable for discharge once bed available. See progress note dated 09/14/18.  Thank you for opportunity to participated in this patient's care.    Toya Smothers, NP

## 2019-02-01 ENCOUNTER — Emergency Department (HOSPITAL_COMMUNITY)
Admission: EM | Admit: 2019-02-01 | Discharge: 2019-02-01 | Disposition: A | Discharge status: Skilled Nursing Facility | Payer: Medicare Other | Attending: Emergency Medicine | Admitting: Emergency Medicine

## 2019-02-01 ENCOUNTER — Emergency Department (HOSPITAL_COMMUNITY): Payer: Medicare Other

## 2019-02-01 DIAGNOSIS — W19XXXA Unspecified fall, initial encounter: Secondary | ICD-10-CM

## 2019-02-01 DIAGNOSIS — S0101XA Laceration without foreign body of scalp, initial encounter: Secondary | ICD-10-CM | POA: Diagnosis not present

## 2019-02-01 DIAGNOSIS — S0990XA Unspecified injury of head, initial encounter: Secondary | ICD-10-CM | POA: Diagnosis present

## 2019-02-01 DIAGNOSIS — I4891 Unspecified atrial fibrillation: Secondary | ICD-10-CM | POA: Diagnosis not present

## 2019-02-01 DIAGNOSIS — Y92128 Other place in nursing home as the place of occurrence of the external cause: Secondary | ICD-10-CM | POA: Insufficient documentation

## 2019-02-01 DIAGNOSIS — F0390 Unspecified dementia without behavioral disturbance: Secondary | ICD-10-CM | POA: Diagnosis not present

## 2019-02-01 DIAGNOSIS — S40812A Abrasion of left upper arm, initial encounter: Secondary | ICD-10-CM | POA: Insufficient documentation

## 2019-02-01 DIAGNOSIS — Y999 Unspecified external cause status: Secondary | ICD-10-CM | POA: Diagnosis not present

## 2019-02-01 DIAGNOSIS — Z79899 Other long term (current) drug therapy: Secondary | ICD-10-CM | POA: Insufficient documentation

## 2019-02-01 DIAGNOSIS — Y9389 Activity, other specified: Secondary | ICD-10-CM | POA: Diagnosis not present

## 2019-02-01 DIAGNOSIS — I10 Essential (primary) hypertension: Secondary | ICD-10-CM | POA: Insufficient documentation

## 2019-02-01 DIAGNOSIS — W01198A Fall on same level from slipping, tripping and stumbling with subsequent striking against other object, initial encounter: Secondary | ICD-10-CM | POA: Diagnosis not present

## 2019-02-01 DIAGNOSIS — S79912A Unspecified injury of left hip, initial encounter: Secondary | ICD-10-CM | POA: Diagnosis not present

## 2019-02-01 DIAGNOSIS — Z7901 Long term (current) use of anticoagulants: Secondary | ICD-10-CM | POA: Diagnosis not present

## 2019-02-01 MED ORDER — CYCLOBENZAPRINE HCL 10 MG PO TABS
5.0000 mg | ORAL_TABLET | Freq: Once | ORAL | Status: DC
  Filled 2019-02-01: qty 1

## 2019-02-01 MED ORDER — LIDOCAINE-EPINEPHRINE 2 %-1:100000 IJ SOLN
10.0000 mL | Freq: Once | INTRAMUSCULAR | Status: AC
  Administered 2019-02-01: 14:00:00 10 mL
  Filled 2019-02-01: qty 1

## 2019-02-01 MED ORDER — ACETAMINOPHEN 500 MG PO TABS
1000.0000 mg | ORAL_TABLET | Freq: Once | ORAL | Status: DC
  Filled 2019-02-01: qty 2

## 2019-02-01 NOTE — ED Notes (Signed)
Patient transported to X-ray & CT °

## 2019-02-01 NOTE — ED Provider Notes (Signed)
5:35 PM-I have received a call back from Dr. French Ana who reviewed the patient's images.  He feels that the patient does not have an acute orthopedic process that would require surgical intervention, at this time.  He recommends that she follow-up with her primary orthopedist, for ongoing management in a week or so.  At this time, patient is alert and fairly comfortable.  Her daughter-in-law is with her and I discussed all the findings, including follow-up plans.  Daughter-in-law states that the patient is a follow-up appointment with her primary orthopedist in 2 weeks.  She will call on Monday to see about advancing the appointment to a sooner date, to check on the injury.  Verbal instructions given for wound care and assistance with ambulation.  Patient does not ambulate currently, and spends her time rotating from bed to [place] chair to wheelchair.   Daleen Bo, MD 02/01/19 [id number]

## 2019-02-01 NOTE — ED Notes (Signed)
Discharge instructions discussed with daughter in-law.

## 2019-02-01 NOTE — ED Provider Notes (Signed)
..  Laceration Repair  Date/Time: 02/01/2019 2:27 PM Performed by: Jacqlyn Larsen, PA-C Authorized by: Jacqlyn Larsen, PA-C   Consent:    Consent obtained:  Verbal   Consent given by:  Patient   Risks discussed:  Infection, pain, poor cosmetic result, poor wound healing and need for additional repair   Alternatives discussed:  No treatment Anesthesia (see MAR for exact dosages):    Anesthesia method:  Local infiltration   Local anesthetic:  Lidocaine 2% WITH epi Laceration details:    Location:  Scalp   Scalp location:  L temporal   Length (cm):  3   Depth (mm):  5 Repair type:    Repair type:  Simple Exploration:    Hemostasis achieved with:  Epinephrine and direct pressure   Wound extent: areolar tissue violated   Treatment:    Area cleansed with:  Saline   Amount of cleaning:  Standard   Irrigation solution:  Sterile saline Skin repair:    Repair method:  Staples   Number of staples:  3 Approximation:    Approximation:  Close Post-procedure details:    Dressing:  Open (no dressing)   Patient tolerance of procedure:  Tolerated well, no immediate complications      Janet Berlin 02/01/19 1428    Gareth Morgan, MD 02/01/19 2112

## 2019-02-01 NOTE — ED Provider Notes (Signed)
[facility]-EMERGENCY DEPT Provider Note   CSN: [id number] Arrival date & time: 02/01/19  1312     History   Chief Complaint Chief Complaint  Patient presents with   Fall    HPI Veronica Flowers is a 85 y.o. female.     HPI  85 year old female with a history of atrial fibrillation on Eliquis, hypertension, close displaced supracondylar fracture of the left femur repaired by Dr. Eulah PontMurphy in April 2020, dementia, residing in [facility] skilled nursing facility, presents with concern for fall.  Patient reports that she was leaning over to pick up a bag from her bed, and fell to the ground hitting her head on her left side.  She reports that she is in a wheelchair at baseline, did not try to stand up following the fall.  She suffered a laceration to her head, and was sent to the emergency department for further evaluation by [facility].  She reports pain over the area of her head, but denies other neck pain or other extremity pain on history.  She is noted to have shortening of the left lower extremity per EMS.  Past Medical History:  Diagnosis Date   A-fib [facility](HCC)    Falls    Femur fracture, left (HCC)    supracondylar   Hypertension    Major depressive disorder    Nausea    Seasonal allergies     Patient Active Problem List   Diagnosis Date Noted   Closed displaced supracondylar fracture of distal end of left femur without intracondylar extension (HCC) 09/11/2018   Unspecified atrial fibrillation (HCC) 09/11/2018   Chronic anticoagulation 09/11/2018   Essential hypertension 09/11/2018   Dementia, presenile with depression (HCC) 09/11/2018   Displaced supracondylar fracture without intracondylar extension of lower end of left femur, initial encounter for closed fracture (HCC) 09/08/2018    Past Surgical History:  Procedure Laterality Date   FRACTURE SURGERY     left femur   ORIF FEMUR FRACTURE Left 09/11/2018   Procedure: OPEN REDUCTION  INTERNAL FIXATION (ORIF) DISTAL FEMUR FRACTURE;  Surgeon: Sheral ApleyMurphy, Timothy D, MD;  Location: [facility] OR;  Service: Orthopedics;  Laterality: Left;     OB History   No obstetric history on file.      Home Medications    Prior to Admission medications   Medication Sig Start Date End Date Taking? Authorizing Provider  acetaminophen (TYLENOL) 500 MG tablet Take 500 mg by mouth every 6 (six) hours as needed for moderate pain or headache.   Yes [provider]  apixaban (ELIQUIS) 5 MG TABS tablet Take 5 mg by mouth 2 (two) times daily.   Yes [provider]  ARIPiprazole (ABILIFY) 2 MG tablet Take 2 mg by mouth daily.   Yes [provider]  cholecalciferol (VITAMIN D3) 25 MCG (1000 UT) tablet Take 1,000 Units by mouth daily.   Yes [provider]  ciprofloxacin (CIPRO) 500 MG tablet Take 500 mg by mouth 2 (two) times daily.   Yes [provider]  digoxin (LANOXIN) 0.125 MG tablet Take 0.125 mg by mouth daily.   Yes [provider]  fluticasone (FLONASE) 50 MCG/ACT nasal spray Place 1 spray into both nostrils 2 (two) times a day.   Yes [provider]  loratadine (CLARITIN) 10 MG tablet Take 10 mg by mouth at bedtime.   Yes [provider]  LORazepam (ATIVAN) 0.5 MG tablet Take 0.5 mg by mouth daily.   Yes [provider]  Melatonin 3 MG  CAPS Take 3 mg by mouth at bedtime.   Yes [provider]  Menthol, Topical Analgesic, (BIOFREEZE) 4 % GEL Apply 1 application topically 3 (three) times daily. Apply to left hip,knee, and thigh   Yes [provider]  metoprolol succinate (TOPROL-XL) 100 MG 24 hr tablet Take 100 mg by mouth daily.    Yes [provider]  Multiple Vitamins-Minerals (CENTRUM PO) Take 1 tablet by mouth daily.   Yes [provider]  nystatin (NYSTATIN) powder Apply 1 g topically 2 (two) times daily. Under breasts   Yes [provider]  polyethylene glycol (MIRALAX /  GLYCOLAX) 17 g packet Take 17 g by mouth daily.   Yes [provider]  sertraline (ZOLOFT) 50 MG tablet Take 50 mg by mouth daily.   Yes [provider]  acetaminophen (TYLENOL) 650 MG CR tablet Take 1 tablet (650 mg total) by mouth every 6 (six) hours as needed for pain. Patient not taking: Reported on 02/01/2019 09/15/18   Albina Billet III, PA-C  senna (SENOKOT) 8.6 MG TABS tablet Take 1 tablet by mouth at bedtime.    [provider]    Family History Family History  Family history unknown: Yes    Social History Social History   Tobacco Use   Smoking status: Never Smoker   Smokeless tobacco: Never Used  Substance Use Topics   Alcohol use: Not Currently   Drug use: Never     Allergies   Patient has no known allergies.   Review of Systems Review of Systems  Unable to perform ROS: Dementia  Constitutional: Negative for fever.  HENT: Negative for sore throat.   Eyes: Negative for visual disturbance.  Respiratory: Negative for cough and shortness of breath.   Cardiovascular: Negative for chest pain.  Gastrointestinal: Negative for abdominal pain.  Genitourinary: Negative for difficulty urinating.  Musculoskeletal: Negative for back pain and neck pain. Arthralgias: denies on history but reports on exam.  Skin: Negative for rash.  Neurological: Positive for headaches (frontal head pain). Negative for syncope.     Physical Exam Updated Vital Signs BP 128/71 (BP Location: Right Arm)    Pulse 75    Temp 98.5 F (36.9 C) (Oral)    Resp 20    SpO2 98%   Physical Exam Vitals signs and nursing note reviewed.  Constitutional:      General: She is not in acute distress.    Appearance: She is well-developed. She is not diaphoretic.  HENT:     Head: Normocephalic.     Comments: Laceration left lateral scalp just posterior to hairline  Eyes:     Conjunctiva/sclera: Conjunctivae normal.  Neck:     Musculoskeletal: Normal range of  motion.  Cardiovascular:     Rate and Rhythm: Normal rate and regular rhythm.  Pulmonary:     Effort: Pulmonary effort is normal. No respiratory distress.     Breath sounds: Normal breath sounds. No wheezing.  Abdominal:     General: There is no distension.     Palpations: Abdomen is soft.     Tenderness: There is no abdominal tenderness. There is no guarding.  Musculoskeletal:        General: No tenderness.     Right lower leg: Edema present.     Left lower leg: Edema present.     Comments: Shortening of LLE, pain with ROM of knee and hip Able to lift RLE, slightly able to lift LLE but reports pain and does  report she has had difficulty moving it since fracture  Skin:    General: Skin is warm and dry.     Findings: No erythema or rash.  Neurological:     Mental Status: She is alert and oriented to person, place, and time.      ED Treatments / Results  Labs (all labs ordered are listed, but only abnormal results are displayed) Labs Reviewed - No data to display  EKG None  Radiology Ct Head Wo Contrast  Result Date: 02/01/2019 CLINICAL DATA:  Unwitnessed fall, left forehead laceration, on Eliquis EXAM: CT HEAD WITHOUT CONTRAST CT CERVICAL SPINE WITHOUT CONTRAST TECHNIQUE: Multidetector CT imaging of the head and cervical spine was performed following the standard protocol without intravenous contrast. Multiplanar CT image reconstructions of the cervical spine were also generated. COMPARISON:  None. FINDINGS: CT HEAD FINDINGS Brain: No evidence of acute infarction, hemorrhage, hydrocephalus, extra-axial collection or mass lesion/mass effect. Global cortical and central atrophy. Subcortical white matter and periventricular small vessel ischemic changes. Vascular: No hyperdense vessel or unexpected calcification. Skull: Normal. Negative for fracture or focal lesion. Sinuses/Orbits: The visualized paranasal sinuses are essentially clear. The mastoid air cells are unopacified. Other:  None. CT CERVICAL SPINE FINDINGS Alignment: Reversal of the normal mid cervical lordosis. Skull base and vertebrae: No acute fracture. No primary bone lesion or focal pathologic process. Soft tissues and spinal canal: No prevertebral fluid or swelling. No visible canal hematoma. Disc levels: Moderate degenerative changes of the mid cervical spine. Spinal canal is patent. Upper chest: Visualized lung apices are clear. Other: Enlargement of the left thyroid gland, although poorly evaluated due to motion degradation. IMPRESSION: No evidence of acute intracranial abnormality. Atrophy with small vessel ischemic changes. No evidence of traumatic injury to the cervical spine. Moderate degenerative changes. Electronically Signed   By: Julian Hy M.D.   On: 02/01/2019 14:18   Ct Cervical Spine Wo Contrast  Result Date: 02/01/2019 CLINICAL DATA:  Unwitnessed fall, left forehead laceration, on Eliquis EXAM: CT HEAD WITHOUT CONTRAST CT CERVICAL SPINE WITHOUT CONTRAST TECHNIQUE: Multidetector CT imaging of the head and cervical spine was performed following the standard protocol without intravenous contrast. Multiplanar CT image reconstructions of the cervical spine were also generated. COMPARISON:  None. FINDINGS: CT HEAD FINDINGS Brain: No evidence of acute infarction, hemorrhage, hydrocephalus, extra-axial collection or mass lesion/mass effect. Global cortical and central atrophy. Subcortical white matter and periventricular small vessel ischemic changes. Vascular: No hyperdense vessel or unexpected calcification. Skull: Normal. Negative for fracture or focal lesion. Sinuses/Orbits: The visualized paranasal sinuses are essentially clear. The mastoid air cells are unopacified. Other: None. CT CERVICAL SPINE FINDINGS Alignment: Reversal of the normal mid cervical lordosis. Skull base and vertebrae: No acute fracture. No primary bone lesion or focal pathologic process. Soft tissues and spinal canal: No prevertebral  fluid or swelling. No visible canal hematoma. Disc levels: Moderate degenerative changes of the mid cervical spine. Spinal canal is patent. Upper chest: Visualized lung apices are clear. Other: Enlargement of the left thyroid gland, although poorly evaluated due to motion degradation. IMPRESSION: No evidence of acute intracranial abnormality. Atrophy with small vessel ischemic changes. No evidence of traumatic injury to the cervical spine. Moderate degenerative changes. Electronically Signed   By: Julian Hy M.D.   On: 02/01/2019 14:18   Dg Knee Complete 4 Views Left  Result Date: 02/01/2019 CLINICAL DATA:  Leg pain after fall EXAM: DG HIP (WITH OR WITHOUT PELVIS) 2-3V LEFT; LEFT KNEE - COMPLETE 4+ VIEW COMPARISON:  09/11/2018 FINDINGS: Patient is status post ORIF of the left femur with anterograde I am nail with proximal partially threaded screw as well as a lateral sideplate and screw fixation construct with single cerclage wire of the distal femur. 15 mm lateral displacement of proximal femoral fracture component just below the greater trochanter. The proximal aspect of the lateral sideplate does not appear flush with the femoral cortex in the proximal most screw appears slightly proud. IMPRESSION: 1. Proximal aspect of the mid-distal left femoral lateral sideplate appears slightly proud compared to intraoperative fluoroscopic images concerning for hardware loosening. 2. Lateral aspect of the left intertrochanteric fracture is displaced laterally approximately 1.5 cm. There are no comparison images available to see if this is a new or acute finding. Electronically Signed   By: Duanne GuessNicholas  Plundo M.D.   On: 02/01/2019 15:04   Dg Hip Unilat W Or Wo Pelvis 2-3 Views Left  Result Date: 02/01/2019 CLINICAL DATA:  Leg pain after fall EXAM: DG HIP (WITH OR WITHOUT PELVIS) 2-3V LEFT; LEFT KNEE - COMPLETE 4+ VIEW COMPARISON:  09/11/2018 FINDINGS: Patient is status post ORIF of the left femur with anterograde  I am nail with proximal partially threaded screw as well as a lateral sideplate and screw fixation construct with single cerclage wire of the distal femur. 15 mm lateral displacement of proximal femoral fracture component just below the greater trochanter. The proximal aspect of the lateral sideplate does not appear flush with the femoral cortex in the proximal most screw appears slightly proud. IMPRESSION: 1. Proximal aspect of the mid-distal left femoral lateral sideplate appears slightly proud compared to intraoperative fluoroscopic images concerning for hardware loosening. 2. Lateral aspect of the left intertrochanteric fracture is displaced laterally approximately 1.5 cm. There are no comparison images available to see if this is a new or acute finding. Electronically Signed   By: Duanne GuessNicholas  Plundo M.D.   On: 02/01/2019 15:04    Procedures Procedures (including critical care time)  Medications Ordered in ED Medications  lidocaine-EPINEPHrine (XYLOCAINE W/EPI) 2 %-1:100000 (with pres) injection 10 mL (10 mLs Other Given by Other 02/01/19 1418)     Initial Impression / Assessment and Plan / ED Course  I have reviewed the triage vital signs and the nursing notes.  Pertinent labs & imaging results that were available during my care of the patient were reviewed by me and considered in my medical decision making (see chart for details).        85 year old female with a history of atrial fibrillation on Eliquis, hypertension, close displaced supracondylar fracture of the left femur repaired by Dr. Eulah PontMurphy in April 2020, dementia, residing in [facility], presents with concern for fall.  CT head and cervical spine completed which showed no sign of acute intracranial bleed or fracture.  No sign of thoracic or abdominal trauma by history and physical exam.  Denies any other neck pain or back pain.  She is noted to have left lower extremity shortening on exam with pain with range  of motion of her knee and hip.  It is unclear whether this shortening present is chronic since fracture in April, or if it represents something new.  While patient did not complain of pain initially, given history of dementia as well as pain on exam, ordered x-rays for further evaluation of leg shortening, knee and hip pain in setting of fall.  Head laceration repaired with staples.   X-ray completed showing signs concerning for hardware loosening of the left femoral sideplate,  as well as lateral displacement of the intertrochanteric fracture unclear if acute given no comparison images.  Orthopedic consult placed, spoke with Dr. Madelon Lips who will review case and call back with recommendations.    Final Clinical Impressions(s) / ED Diagnoses   Final diagnoses:  Fall, initial encounter  Laceration of scalp, initial encounter    ED Discharge Orders    None       Alvira Monday, MD 02/01/19 1550

## 2019-02-01 NOTE — ED Notes (Signed)
AVS sent with PTAR

## 2019-02-01 NOTE — ED Notes (Signed)
Pt denies any pain. Pt has laceration to left temporal region of head that has been stapled. Pt denies headache, nausea or vision changes.

## 2019-02-01 NOTE — ED Notes (Signed)
Medication discontinued ordered for wrong patient

## 2019-02-01 NOTE — Discharge Instructions (Addendum)
There were no serious injuries found from the fall today.  Keep the stapled wound clean with soap and water daily and apply a antibiotic ointment such as Neosporin.  Use the same process to clean the abrasion of the left upper arm.  Your head and left hip will likely be sore for a few days.  Follow-up with your orthopedic doctor regarding the left hip injury, within 1 week.  Call them on Monday morning to arrange an appointment.  Return here, if needed, for problems.

## 2019-02-01 NOTE — ED Triage Notes (Signed)
Transported by [facility] from [facility]--- unwitnessed fall after attempting to lean and pick up something off the floor. Laceration to left side of forehead. Patient is taking Eliquis for A fib. Also shortening to left leg per EMS. AAO x 3.

## 2019-05-08 ENCOUNTER — Emergency Department (HOSPITAL_COMMUNITY): Payer: Medicare Other

## 2019-05-08 ENCOUNTER — Other Ambulatory Visit: Payer: Self-pay

## 2019-05-08 ENCOUNTER — Observation Stay (HOSPITAL_COMMUNITY)
Admission: EM | Admit: 2019-05-08 | Discharge: 2019-05-11 | Disposition: A | Discharge status: Skilled Nursing Facility | Payer: Medicare Other | Attending: Internal Medicine | Admitting: Internal Medicine

## 2019-05-08 DIAGNOSIS — M25562 Pain in left knee: Secondary | ICD-10-CM | POA: Diagnosis present

## 2019-05-08 DIAGNOSIS — S0990XA Unspecified injury of head, initial encounter: Principal | ICD-10-CM | POA: Insufficient documentation

## 2019-05-08 DIAGNOSIS — M25552 Pain in left hip: Secondary | ICD-10-CM | POA: Insufficient documentation

## 2019-05-08 DIAGNOSIS — Z7901 Long term (current) use of anticoagulants: Secondary | ICD-10-CM | POA: Insufficient documentation

## 2019-05-08 DIAGNOSIS — Z79899 Other long term (current) drug therapy: Secondary | ICD-10-CM | POA: Insufficient documentation

## 2019-05-08 DIAGNOSIS — Z20828 Contact with and (suspected) exposure to other viral communicable diseases: Secondary | ICD-10-CM | POA: Insufficient documentation

## 2019-05-08 DIAGNOSIS — I480 Paroxysmal atrial fibrillation: Secondary | ICD-10-CM | POA: Insufficient documentation

## 2019-05-08 DIAGNOSIS — I10 Essential (primary) hypertension: Secondary | ICD-10-CM | POA: Diagnosis present

## 2019-05-08 DIAGNOSIS — F329 Major depressive disorder, single episode, unspecified: Secondary | ICD-10-CM | POA: Diagnosis not present

## 2019-05-08 DIAGNOSIS — W1830XA Fall on same level, unspecified, initial encounter: Secondary | ICD-10-CM | POA: Diagnosis not present

## 2019-05-08 DIAGNOSIS — F0390 Unspecified dementia without behavioral disturbance: Secondary | ICD-10-CM | POA: Insufficient documentation

## 2019-05-08 DIAGNOSIS — I4891 Unspecified atrial fibrillation: Secondary | ICD-10-CM | POA: Diagnosis present

## 2019-05-08 DIAGNOSIS — R519 Headache, unspecified: Secondary | ICD-10-CM | POA: Diagnosis present

## 2019-05-08 DIAGNOSIS — R0789 Other chest pain: Secondary | ICD-10-CM | POA: Diagnosis not present

## 2019-05-08 DIAGNOSIS — R410 Disorientation, unspecified: Secondary | ICD-10-CM | POA: Diagnosis present

## 2019-05-08 DIAGNOSIS — W19XXXA Unspecified fall, initial encounter: Secondary | ICD-10-CM

## 2019-05-08 LAB — CBC WITH DIFFERENTIAL/PLATELET
Abs Immature Granulocytes: 0.05 10*3/uL (ref 0.00–0.07)
Basophils Absolute: 0.1 10*3/uL (ref 0.0–0.1)
Basophils Relative: 0 %
Eosinophils Absolute: 0.1 10*3/uL (ref 0.0–0.5)
Eosinophils Relative: 1 %
HCT: 40.4 % (ref 36.0–46.0)
Hemoglobin: 13.3 g/dL (ref 12.0–15.0)
Immature Granulocytes: 0 %
Lymphocytes Relative: 13 %
Lymphs Abs: 1.5 10*3/uL (ref 0.7–4.0)
MCH: 32.3 pg (ref 26.0–34.0)
MCHC: 32.9 g/dL (ref 30.0–36.0)
MCV: 98.1 fL (ref 80.0–100.0)
Monocytes Absolute: 0.7 10*3/uL (ref 0.1–1.0)
Monocytes Relative: 6 %
Neutro Abs: 9.4 10*3/uL — ABNORMAL HIGH (ref 1.7–7.7)
Neutrophils Relative %: 80 %
Platelets: 280 10*3/uL (ref 150–400)
RBC: 4.12 MIL/uL (ref 3.87–5.11)
RDW: 12.6 % (ref 11.5–15.5)
WBC: 11.8 10*3/uL — ABNORMAL HIGH (ref 4.0–10.5)
nRBC: 0 % (ref 0.0–0.2)

## 2019-05-08 LAB — BASIC METABOLIC PANEL
Anion gap: 7 (ref 5–15)
BUN: 13 mg/dL (ref 8–23)
CO2: 26 mmol/L (ref 22–32)
Calcium: 9.1 mg/dL (ref 8.9–10.3)
Chloride: 103 mmol/L (ref 98–111)
Creatinine, Ser: 1.1 mg/dL — ABNORMAL HIGH (ref 0.44–1.00)
GFR calc Af Amer: 52 mL/min — ABNORMAL LOW (ref >60)
GFR calc non Af Amer: 44 mL/min — ABNORMAL LOW (ref >60)
Glucose, Bld: 119 mg/dL — ABNORMAL HIGH (ref 70–99)
Potassium: 4.3 mmol/L (ref 3.5–5.1)
Sodium: 136 mmol/L (ref 135–145)

## 2019-05-08 LAB — URINALYSIS, ROUTINE W REFLEX MICROSCOPIC
Bilirubin Urine: NEGATIVE
Glucose, UA: NEGATIVE mg/dL
Hgb urine dipstick: NEGATIVE
Ketones, ur: NEGATIVE mg/dL
Leukocytes,Ua: NEGATIVE
Nitrite: NEGATIVE
Protein, ur: NEGATIVE mg/dL
Specific Gravity, Urine: 1.013 (ref 1.005–1.030)
pH: 7 (ref 5.0–8.0)

## 2019-05-08 MED ORDER — ONDANSETRON HCL 4 MG PO TABS: 4.0000 mg | ORAL_TABLET | Freq: Four times a day (QID) | ORAL | Status: DC | PRN

## 2019-05-08 MED ORDER — METOPROLOL SUCCINATE ER 50 MG PO TB24
100.0000 mg | ORAL_TABLET | Freq: Every day | ORAL | Status: DC
  Administered 2019-05-09 – 2019-05-10 (×2): 100 mg via ORAL
  Filled 2019-05-08 (×3): qty 2

## 2019-05-08 MED ORDER — ACETAMINOPHEN 500 MG PO TABS: 500.0000 mg | ORAL_TABLET | Freq: Four times a day (QID) | ORAL | Status: DC | PRN

## 2019-05-08 MED ORDER — ASCORBIC ACID 500 MG PO TABS
500.0000 mg | ORAL_TABLET | Freq: Every day | ORAL | Status: DC
  Administered 2019-05-09 – 2019-05-11 (×3): 500 mg via ORAL
  Filled 2019-05-08 (×3): qty 1

## 2019-05-08 MED ORDER — FUROSEMIDE 20 MG PO TABS
20.0000 mg | ORAL_TABLET | Freq: Every day | ORAL | Status: DC
  Administered 2019-05-09 – 2019-05-11 (×3): 20 mg via ORAL
  Filled 2019-05-08 (×3): qty 1

## 2019-05-08 MED ORDER — ONDANSETRON HCL 4 MG/2ML IJ SOLN: 4.0000 mg | Freq: Four times a day (QID) | INTRAMUSCULAR | Status: DC | PRN

## 2019-05-08 MED ORDER — SERTRALINE HCL 100 MG PO TABS
300.0000 mg | ORAL_TABLET | ORAL | Status: DC
  Administered 2019-05-09 – 2019-05-11 (×2): 300 mg via ORAL
  Filled 2019-05-08 (×2): qty 3

## 2019-05-08 MED ORDER — DIGOXIN 125 MCG PO TABS
0.1250 mg | ORAL_TABLET | Freq: Every day | ORAL | Status: DC
  Administered 2019-05-09 – 2019-05-11 (×3): 0.125 mg via ORAL
  Filled 2019-05-08 (×3): qty 1

## 2019-05-08 MED ORDER — ACETAMINOPHEN 325 MG PO TABS
650.0000 mg | ORAL_TABLET | Freq: Four times a day (QID) | ORAL | Status: DC | PRN
  Administered 2019-05-09 (×2): 650 mg via ORAL
  Filled 2019-05-08 (×2): qty 2

## 2019-05-08 MED ORDER — LORAZEPAM 0.5 MG PO TABS
0.5000 mg | ORAL_TABLET | Freq: Every day | ORAL | Status: DC
  Administered 2019-05-09 – 2019-05-11 (×3): 0.5 mg via ORAL
  Filled 2019-05-08 (×3): qty 1

## 2019-05-08 MED ORDER — SENNA 8.6 MG PO TABS
1.0000 | ORAL_TABLET | Freq: Every day | ORAL | Status: DC
  Administered 2019-05-09 – 2019-05-10 (×3): 8.6 mg via ORAL
  Filled 2019-05-08 (×3): qty 1

## 2019-05-08 MED ORDER — FLUTICASONE PROPIONATE 50 MCG/ACT NA SUSP
1.0000 | Freq: Every day | NASAL | Status: DC
  Filled 2019-05-08 (×2): qty 16

## 2019-05-08 MED ORDER — NYSTATIN 100000 UNIT/GM EX POWD
1.0000 g | Freq: Two times a day (BID) | CUTANEOUS | Status: DC
  Administered 2019-05-09 – 2019-05-11 (×5): 1 via TOPICAL
  Filled 2019-05-08: qty 15

## 2019-05-08 MED ORDER — LORATADINE 10 MG PO TABS
10.0000 mg | ORAL_TABLET | Freq: Every day | ORAL | Status: DC
  Administered 2019-05-09 – 2019-05-10 (×2): 10 mg via ORAL
  Filled 2019-05-08 (×2): qty 1

## 2019-05-08 MED ORDER — MUSCLE RUB 10-15 % EX CREA
1.0000 "application " | TOPICAL_CREAM | Freq: Three times a day (TID) | CUTANEOUS | Status: DC
  Administered 2019-05-09 – 2019-05-11 (×7): 1 via TOPICAL
  Filled 2019-05-08: qty 85

## 2019-05-08 MED ORDER — VITAMIN D 25 MCG (1000 UNIT) PO TABS
1000.0000 [IU] | ORAL_TABLET | Freq: Every day | ORAL | Status: DC
  Administered 2019-05-09 – 2019-05-11 (×3): 1000 [IU] via ORAL
  Filled 2019-05-08 (×3): qty 1

## 2019-05-08 MED ORDER — ACETAMINOPHEN 650 MG RE SUPP: 650.0000 mg | Freq: Four times a day (QID) | RECTAL | Status: DC | PRN

## 2019-05-08 MED ORDER — POTASSIUM CHLORIDE CRYS ER 10 MEQ PO TBCR
10.0000 meq | EXTENDED_RELEASE_TABLET | Freq: Every day | ORAL | Status: DC
  Administered 2019-05-09 – 2019-05-11 (×3): 10 meq via ORAL
  Filled 2019-05-08 (×6): qty 1

## 2019-05-08 MED ORDER — APIXABAN 5 MG PO TABS
5.0000 mg | ORAL_TABLET | Freq: Two times a day (BID) | ORAL | Status: DC
  Administered 2019-05-09 (×2): 5 mg via ORAL
  Filled 2019-05-08 (×2): qty 1

## 2019-05-08 MED ORDER — MELATONIN 3 MG PO TABS
3.0000 mg | ORAL_TABLET | Freq: Every day | ORAL | Status: DC
  Administered 2019-05-09 – 2019-05-10 (×3): 3 mg via ORAL
  Filled 2019-05-08 (×4): qty 1

## 2019-05-08 MED ORDER — ARIPIPRAZOLE 2 MG PO TABS
2.0000 mg | ORAL_TABLET | Freq: Every day | ORAL | Status: DC
  Administered 2019-05-09 – 2019-05-11 (×3): 2 mg via ORAL
  Filled 2019-05-08 (×3): qty 1

## 2019-05-08 MED ORDER — POLYETHYLENE GLYCOL 3350 17 G PO PACK
17.0000 g | PACK | Freq: Every day | ORAL | Status: DC
  Administered 2019-05-09 – 2019-05-11 (×3): 17 g via ORAL
  Filled 2019-05-08 (×3): qty 1

## 2019-05-08 NOTE — H&P (Signed)
History and Physical    Veronica Flowers ZOX:[id number] DOB: 1927/09/13 DOA: 05/08/2019  PCP: System, Pcp Not In  Patient coming from: [facility]  I have personally briefly reviewed patient's old medical records in [facility]  Chief Complaint: Fall, knee pain  HPI: Veronica Flowers is a 85 y.o. female with medical history significant of A.Fib believed to be on eliquis, HTN.  Doesn't sound like she has significant dementia at baseline from discussion with daughter-in-law (though this is listed in problem list).  Patient presents to the ED after a fall getting out of bed to try to go to bathroom.  Turns out patient has been non-ambulatory and uses a wheelchair since 2 falls earlier this year with hip and distal femur fractures.   ED Course: Multiple imaging studies reveal no new fracture of hip or knee.  No knee pain at rest but unable to bear weight on knee when ED tried to ambulate her (before we found out she was non-ambulatory at baseline).  Does have periorbital hematoma on CT, but no ICH.  On further discussion with family, sounds like patient was somewhat confused yesterday, first time that patient started asking about seeing her mother (who is dead for many years).   Review of Systems: As per HPI, otherwise all review of systems negative.  Past Medical History:  Diagnosis Date  . A-fib (HCC)   . Falls   . Femur fracture, left (HCC)    supracondylar  . Hypertension   . Major depressive disorder   . Nausea   . Seasonal allergies     Past Surgical History:  Procedure Laterality Date  . FRACTURE SURGERY     left femur  . ORIF FEMUR FRACTURE Left 09/11/2018   Procedure: OPEN REDUCTION INTERNAL FIXATION (ORIF) DISTAL FEMUR FRACTURE;  Surgeon: Sheral Apley, MD;  Location: [facility] OR;  Service: Orthopedics;  Laterality: Left;     reports that she has never smoked. She has never used smokeless tobacco. She reports previous alcohol use. She reports that she does not  use drugs.  No Known Allergies  Family History  Family history unknown: Yes     Prior to Admission medications   Medication Sig Start Date End Date Taking? Authorizing Provider  acetaminophen (TYLENOL) 500 MG tablet Take 500 mg by mouth every 6 (six) hours as needed for moderate pain or headache.    [provider]  acetaminophen (TYLENOL) 650 MG CR tablet Take 1 tablet (650 mg total) by mouth every 6 (six) hours as needed for pain. Patient not taking: Reported on 02/01/2019 09/15/18   Albina Billet III, PA-C  apixaban (ELIQUIS) 5 MG TABS tablet Take 5 mg by mouth 2 (two) times daily.    [provider]  ARIPiprazole (ABILIFY) 2 MG tablet Take 2 mg by mouth daily.    [provider]  cholecalciferol (VITAMIN D3) 25 MCG (1000 UT) tablet Take 1,000 Units by mouth daily.    [provider]  ciprofloxacin (CIPRO) 500 MG tablet Take 500 mg by mouth 2 (two) times daily.    [provider]  digoxin (LANOXIN) 0.125 MG tablet Take 0.125 mg by mouth daily.    [provider]  fluticasone (FLONASE) 50 MCG/ACT nasal spray Place 1 spray into both nostrils 2 (two) times a day.    [provider]  loratadine (CLARITIN) 10 MG tablet Take 10 mg by mouth at bedtime.    [provider]  LORazepam (ATIVAN) 0.5 MG tablet Take  0.5 mg by mouth daily.    [provider]  Melatonin 3 MG CAPS Take 3 mg by mouth at bedtime.    [provider]  Menthol, Topical Analgesic, (BIOFREEZE) 4 % GEL Apply 1 application topically 3 (three) times daily. Apply to left hip,knee, and thigh    [provider]  metoprolol succinate (TOPROL-XL) 100 MG 24 hr tablet Take 100 mg by mouth daily.     [provider]  Multiple Vitamins-Minerals (CENTRUM PO) Take 1 tablet by mouth daily.    [provider]  nystatin (NYSTATIN) powder Apply 1 g topically 2 (two) times daily. Under breasts    [provider]    polyethylene glycol (MIRALAX / GLYCOLAX) 17 g packet Take 17 g by mouth daily.    [provider]  senna (SENOKOT) 8.6 MG TABS tablet Take 1 tablet by mouth at bedtime.    [provider]  sertraline (ZOLOFT) 50 MG tablet Take 50 mg by mouth daily.    [provider]    Physical Exam: Vitals:   05/08/19 2015 05/08/19 2030 05/08/19 2045 05/08/19 2100  BP: (!) 140/58 (!) 144/68 (!) 142/73 126/63  Pulse: 75 73 74   Resp: 19 20 20 19   Temp:      TempSrc:      SpO2: 99% 97% 98%   Weight:      Height:        Constitutional: NAD, calm, comfortable Eyes: PERRL, lids and conjunctivae normal ENMT: Mucous membranes are moist. Posterior pharynx clear of any exudate or lesions.Normal dentition.  Neck: normal, supple, no masses, no thyromegaly Respiratory: clear to auscultation bilaterally, no wheezing, no crackles. Normal respiratory effort. No accessory muscle use.  Cardiovascular: Regular rate and rhythm, no murmurs / rubs / gallops. No extremity edema. 2+ pedal pulses. No carotid bruits.  Abdomen: no tenderness, no masses palpated. No hepatosplenomegaly. Bowel sounds positive.  Musculoskeletal: no clubbing / cyanosis. No joint deformity upper and lower extremities. Good ROM, no contractures. Normal muscle tone.  Skin: no rashes, lesions, ulcers. No induration Neurologic: CN 2-12 grossly intact. Sensation intact, DTR normal. Strength 5/5 in all 4.  Psychiatric: Oriented to self, situation, place.  Now aknowledges she doesn't walk at baseline and uses a wheelchair.  Does seem somewhat confused "need to go home soon to see my mother".   Labs on Admission: I have personally reviewed following labs and imaging studies  CBC: Recent Labs  Lab 05/08/19 1935  WBC 11.8*  NEUTROABS 9.4*  HGB 13.3  HCT 40.4  MCV 98.1  PLT 280   Basic Metabolic Panel: Recent Labs  Lab 05/08/19 1935  NA 136  K 4.3  CL 103  CO2 26  GLUCOSE 119*  BUN 13  CREATININE 1.10*   CALCIUM 9.1   GFR: Estimated Creatinine Clearance: 32.3 mL/min (A) (by C-G formula based on SCr of 1.1 mg/dL (H)). Liver Function Tests: No results for input(s): AST, ALT, ALKPHOS, BILITOT, PROT, ALBUMIN in the last 168 hours. No results for input(s): LIPASE, AMYLASE in the last 168 hours. No results for input(s): AMMONIA in the last 168 hours. Coagulation Profile: No results for input(s): INR, PROTIME in the last 168 hours. Cardiac Enzymes: No results for input(s): CKTOTAL, CKMB, CKMBINDEX, TROPONINI in the last 168 hours. BNP (last 3 results) No results for input(s): PROBNP in the last 8760 hours. HbA1C: No results for input(s): HGBA1C in the last 72 hours. CBG: No results for input(s): GLUCAP in the last 168  hours. Lipid Profile: No results for input(s): CHOL, HDL, LDLCALC, TRIG, CHOLHDL, LDLDIRECT in the last 72 hours. Thyroid Function Tests: No results for input(s): TSH, T4TOTAL, FREET4, T3FREE, THYROIDAB in the last 72 hours. Anemia Panel: No results for input(s): VITAMINB12, FOLATE, FERRITIN, TIBC, IRON, RETICCTPCT in the last 72 hours. Urine analysis: No results found for: COLORURINE, APPEARANCEUR, LABSPEC, PHURINE, GLUCOSEU, HGBUR, BILIRUBINUR, KETONESUR, PROTEINUR, UROBILINOGEN, NITRITE, LEUKOCYTESUR  Radiological Exams on Admission: DG Chest 1 View  Result Date: 05/08/2019 CLINICAL DATA:  Fall attempting to get out of bed. EXAM: CHEST  1 VIEW COMPARISON:  None. FINDINGS: The heart is enlarged. Atherosclerotic changes are noted at the aortic arch. Is no edema or effusion. No focal airspace disease is present. Degenerative changes are present in the shoulders, left greater than right. IMPRESSION: 1. Cardiomegaly without failure. 2. No acute cardiopulmonary disease. 3. Aortic atherosclerosis. Electronically Signed   By: Marin Roberts M.D.   On: 05/08/2019 19:01   DG Pelvis 1-2 Views  Result Date: 05/08/2019 CLINICAL DATA:  Unwitnessed fall from bed. EXAM: PELVIS -  1-2 VIEW COMPARISON:  Pelvic and left hip radiographs 02/01/2019. FINDINGS: Status post intramedullary nail and dynamic screw fixation of an intertrochanteric left intertrochanteric femur fracture. The hardware is intact, but incompletely visualized on these views. The mildly displaced intertrochanteric fracture appears unchanged. No evidence of acute pelvic fracture or dislocation. Mild spondylosis in the lower lumbar spine. IMPRESSION: No acute pelvic findings. Stable appearance of the left femoral intertrochanteric fracture post ORIF. Electronically Signed   By: Carey Bullocks M.D.   On: 05/08/2019 19:05   CT Head Wo Contrast  Result Date: 05/08/2019 CLINICAL DATA:  Head trauma. Unwitnessed fall bed. Difficulty moving right lower extremity. Left periorbital soft tissue swelling and hematoma. The patient is on Eliquis. EXAM: CT HEAD WITHOUT CONTRAST CT MAXILLOFACIAL WITHOUT CONTRAST CT CERVICAL SPINE WITHOUT CONTRAST TECHNIQUE: Multidetector CT imaging of the head, cervical spine, and maxillofacial structures were performed using the standard protocol without intravenous contrast. Multiplanar CT image reconstructions of the cervical spine and maxillofacial structures were also generated. COMPARISON:  None. FINDINGS: CT HEAD FINDINGS Brain: Moderate atrophy and white matter changes are present bilaterally. No acute infarct, hemorrhage, or mass lesion is present. Basal ganglia intact. The ventricles are proportionate to the degree of atrophy. No significant extraaxial fluid collection is present. The brainstem and cerebellum are within normal limits. Vascular: Minimal atherosclerotic changes are present at cavernous internal carotid arteries. There is no hyperdense vessel. Skull: Left periorbital soft tissue hematoma is present. There is no underlying fracture. Calvarium is intact. No other focal soft tissue trauma is present. CT MAXILLOFACIAL FINDINGS Osseous: No acute fractures are present. The mandible is  intact and located. Orbits: Left periorbital hematoma is present without underlying fracture. Bilateral lens replacements are noted. Globes and orbits are otherwise unremarkable. Sinuses: The paranasal sinuses and mastoid air cells are clear. Soft tissues: No other focal soft tissue injury is present. Facial muscles are within normal limits otherwise. CT CERVICAL SPINE FINDINGS Alignment: No significant listhesis is present. There is straightening of the normal cervical lordosis. Skull base and vertebrae: Craniocervical junction is within normal limits. Mild degenerative changes are noted. Vertebral body heights are maintained. No acute or healing fractures are present. Soft tissues and spinal canal: No prevertebral fluid or swelling. No visible canal hematoma. Disc levels: Multilevel facet degenerative changes are present in the upper cervical spine. Right greater than left uncovertebral spurring is greatest at C5-6 and C6-7. Upper chest: The lung apices  are clear. Thoracic inlet is normal. IMPRESSION: 1. Left periorbital soft tissue hematoma without underlying fracture. 2. No other focal soft tissue trauma. 3. Normal CT appearance of the brain for age. 4. No acute osseous injury to the face. 5. Multilevel degenerative changes of the cervical spine without acute fracture or traumatic subluxation. Electronically Signed   By: Marin Roberts M.D.   On: 05/08/2019 18:41   CT Cervical Spine Wo Contrast  Result Date: 05/08/2019 CLINICAL DATA:  Head trauma. Unwitnessed fall bed. Difficulty moving right lower extremity. Left periorbital soft tissue swelling and hematoma. The patient is on Eliquis. EXAM: CT HEAD WITHOUT CONTRAST CT MAXILLOFACIAL WITHOUT CONTRAST CT CERVICAL SPINE WITHOUT CONTRAST TECHNIQUE: Multidetector CT imaging of the head, cervical spine, and maxillofacial structures were performed using the standard protocol without intravenous contrast. Multiplanar CT image reconstructions of the cervical  spine and maxillofacial structures were also generated. COMPARISON:  None. FINDINGS: CT HEAD FINDINGS Brain: Moderate atrophy and white matter changes are present bilaterally. No acute infarct, hemorrhage, or mass lesion is present. Basal ganglia intact. The ventricles are proportionate to the degree of atrophy. No significant extraaxial fluid collection is present. The brainstem and cerebellum are within normal limits. Vascular: Minimal atherosclerotic changes are present at cavernous internal carotid arteries. There is no hyperdense vessel. Skull: Left periorbital soft tissue hematoma is present. There is no underlying fracture. Calvarium is intact. No other focal soft tissue trauma is present. CT MAXILLOFACIAL FINDINGS Osseous: No acute fractures are present. The mandible is intact and located. Orbits: Left periorbital hematoma is present without underlying fracture. Bilateral lens replacements are noted. Globes and orbits are otherwise unremarkable. Sinuses: The paranasal sinuses and mastoid air cells are clear. Soft tissues: No other focal soft tissue injury is present. Facial muscles are within normal limits otherwise. CT CERVICAL SPINE FINDINGS Alignment: No significant listhesis is present. There is straightening of the normal cervical lordosis. Skull base and vertebrae: Craniocervical junction is within normal limits. Mild degenerative changes are noted. Vertebral body heights are maintained. No acute or healing fractures are present. Soft tissues and spinal canal: No prevertebral fluid or swelling. No visible canal hematoma. Disc levels: Multilevel facet degenerative changes are present in the upper cervical spine. Right greater than left uncovertebral spurring is greatest at C5-6 and C6-7. Upper chest: The lung apices are clear. Thoracic inlet is normal. IMPRESSION: 1. Left periorbital soft tissue hematoma without underlying fracture. 2. No other focal soft tissue trauma. 3. Normal CT appearance of the  brain for age. 4. No acute osseous injury to the face. 5. Multilevel degenerative changes of the cervical spine without acute fracture or traumatic subluxation. Electronically Signed   By: Marin Roberts M.D.   On: 05/08/2019 18:41   CT Knee Left Wo Contrast  Result Date: 05/08/2019 CLINICAL DATA:  85 year old female with left knee fracture. EXAM: CT OF THE left KNEE WITHOUT CONTRAST TECHNIQUE: Multidetector CT imaging of the left knee was performed according to the standard protocol. Multiplanar CT image reconstructions were also generated. COMPARISON:  Left knee radiograph dated 05/08/2019 and 02/01/2019. FINDINGS: Evaluation is limited due to streak artifact caused by metallic fixation hardware. Bones/Joint/Cartilage There is no acute fracture or dislocation. Focal area of indentation of the anterior aspect of the distal femur similar to prior radiograph. There is advanced osteopenia and osteoarthritic changes of the knee. There is a small suprapatellar effusion. Ligaments Suboptimally assessed by CT. Muscles and Tendons No acute findings. Soft tissues There is diffuse skin thickening and subcutaneous soft  tissue edema of the lateral aspect of the knee. No fluid collection. Clinical correlation is recommended to evaluate for cellulitis or an infectious process. IMPRESSION: 1. No acute fracture or dislocation. 2. Advanced osteopenia and osteoarthritic changes of the knee. 3. Small suprapatellar effusion. 4. Skin thickening and subcutaneous soft tissue edema of the lateral aspect of the knee. Clinical correlation is recommended to evaluate for cellulitis or an infectious process. Electronically Signed   By: Elgie Collard M.D.   On: 05/08/2019 20:37   DG FEMUR MIN 2 VIEWS LEFT  Result Date: 05/08/2019 CLINICAL DATA:  Unwitnessed fall from bed, bruising and abrasion LEFT knee EXAM: LEFT FEMUR 2 VIEWS COMPARISON:  LEFT knee 02/01/2019 FINDINGS: Osseous demineralization. IM nail across a prior  intertrochanteric fracture LEFT femur. Visualized pelvis intact. Lateral plate, multiple screws, and a cerclage wire identified across a previously identified distal LEFT femoral metaphyseal fracture. Hardware intact. Diffuse joint space narrowing LEFT knee. No new fracture, dislocation, or bone destruction. IMPRESSION: Post ORIF of previous proximal and distal LEFT femoral fractures. No new osseous abnormalities identified. Osseous demineralization with degenerative changes LEFT knee. Electronically Signed   By: Ulyses Southward M.D.   On: 05/08/2019 19:01   CT Maxillofacial WO CM  Result Date: 05/08/2019 CLINICAL DATA:  Head trauma. Unwitnessed fall bed. Difficulty moving right lower extremity. Left periorbital soft tissue swelling and hematoma. The patient is on Eliquis. EXAM: CT HEAD WITHOUT CONTRAST CT MAXILLOFACIAL WITHOUT CONTRAST CT CERVICAL SPINE WITHOUT CONTRAST TECHNIQUE: Multidetector CT imaging of the head, cervical spine, and maxillofacial structures were performed using the standard protocol without intravenous contrast. Multiplanar CT image reconstructions of the cervical spine and maxillofacial structures were also generated. COMPARISON:  None. FINDINGS: CT HEAD FINDINGS Brain: Moderate atrophy and white matter changes are present bilaterally. No acute infarct, hemorrhage, or mass lesion is present. Basal ganglia intact. The ventricles are proportionate to the degree of atrophy. No significant extraaxial fluid collection is present. The brainstem and cerebellum are within normal limits. Vascular: Minimal atherosclerotic changes are present at cavernous internal carotid arteries. There is no hyperdense vessel. Skull: Left periorbital soft tissue hematoma is present. There is no underlying fracture. Calvarium is intact. No other focal soft tissue trauma is present. CT MAXILLOFACIAL FINDINGS Osseous: No acute fractures are present. The mandible is intact and located. Orbits: Left periorbital hematoma  is present without underlying fracture. Bilateral lens replacements are noted. Globes and orbits are otherwise unremarkable. Sinuses: The paranasal sinuses and mastoid air cells are clear. Soft tissues: No other focal soft tissue injury is present. Facial muscles are within normal limits otherwise. CT CERVICAL SPINE FINDINGS Alignment: No significant listhesis is present. There is straightening of the normal cervical lordosis. Skull base and vertebrae: Craniocervical junction is within normal limits. Mild degenerative changes are noted. Vertebral body heights are maintained. No acute or healing fractures are present. Soft tissues and spinal canal: No prevertebral fluid or swelling. No visible canal hematoma. Disc levels: Multilevel facet degenerative changes are present in the upper cervical spine. Right greater than left uncovertebral spurring is greatest at C5-6 and C6-7. Upper chest: The lung apices are clear. Thoracic inlet is normal. IMPRESSION: 1. Left periorbital soft tissue hematoma without underlying fracture. 2. No other focal soft tissue trauma. 3. Normal CT appearance of the brain for age. 4. No acute osseous injury to the face. 5. Multilevel degenerative changes of the cervical spine without acute fracture or traumatic subluxation. Electronically Signed   By: Marin Roberts M.D.   On:  05/08/2019 18:41    EKG: Independently reviewed.  Assessment/Plan Principal Problem:   Confusion Active Problems:   Unspecified atrial fibrillation (HCC)   Essential hypertension   Dementia, presenile with depression (HCC)   Left knee pain    1. Confusion - 1. UTI? Concussion? Progression of dementia? 2. Check UA 3. obs overnight 2. A.Fib - 1. Continue home meds once med rec completed 3. HTN - 1. Cont home meds 4. L knee pain - 1. Only with wt bearing, no pain at rest 2. No fracture on imaging studies  DVT prophylaxis: Med rec pending, think that shes on eliquis though Code Status:  Full Family Communication: Daughter-in-law on phone Disposition Plan: TBD ALF vs SNF Consults called: None Admission status: Place in 36   Kaysi Ourada, [facility]  How to contact the [facility] Attending or Consulting provider [place] or covering provider during after hours [place], for this patient?  1. Check the care team in [facility] and look for a) attending/consulting [facility] provider listed and b) the [facility] team listed 2. Log into [email or web address]  Amion Physician Scheduling and messaging for groups and whole hospitals  On call and physician scheduling software for group practices, residents, hospitalists and other medical providers for call, clinic, rotation and shift schedules. OnCall [employer] is a hospital-wide system for scheduling doctors and paging doctors on call. EasyPlot is for scientific plotting and data analysis.  [email or web address]  and use [place]'s universal password to access. If you do not have the password, please contact the hospital operator.  3. Locate the [facility] provider you are looking for under [facility] and page to a number that you can be directly reached. 4. If you still have difficulty reaching the provider, please page the [facility] (Director on Call) for the Hospitalists listed on amion for assistance.  05/08/2019, 9:35 PM

## 2019-05-08 NOTE — ED Triage Notes (Addendum)
In by EMS unwitnessed fall from bed, hxof difficulty with R leg movement and ambulation and attempted to get OOB.   Staff state she was not down very long.  Initally Ems picked her up from laying on R side.   When moved c/o pain to L knee, does have laceration and bruising to orbit.  Alert and oriented, some intermittent confusion.  Does take Eliquis, pain only to R eye and L knee.  Staff reports patient is at her baseline for cognition.  Pt does state she was down for approx 1 hour.

## 2019-05-08 NOTE — ED Notes (Signed)
Purewick has been placed on the pt.

## 2019-05-08 NOTE — ED Triage Notes (Signed)
Correction- when patient evaluated she has bruising and swelling to L orbit and abrasion noted to L knee.  Does have shortening to L leg as well.  She states she was sitting at bedside and attempted to get up and had "slippery socks" and fell.  Pt in NAD or outward pain responses, calm and quiet.

## 2019-05-08 NOTE — ED Notes (Signed)
[place] pts daughter in law, wants a pt update

## 2019-05-08 NOTE — ED Provider Notes (Signed)
[facility] EMERGENCY DEPARTMENT Provider Note   CSN: [id number] Arrival date & time: 05/08/19  1738     History No chief complaint on file.   Veronica Flowers is a 85 y.o. female with a past medical history of A. fib anticoagulated with Eliquis, supracondylar left femur fracture, dementia, who presents today for evaluation after an unwitnessed fall from bed.  History obtained from triage note.  According to staff at facility patient is at her baseline.  She reports that she was sitting on the side of her bed to try and walk to the bathroom and attempted to stand up however had on "slippery socks" causing her to fall.  She reports that she has pain in her left knee, and left-sided face above her eye without any chest pain or shortness of breath.  She denies any prodromal symptoms.  She denies any vision changes.     HPI     Past Medical History:  Diagnosis Date  . A-fib (HCC)   . Falls   . Femur fracture, left (HCC)    supracondylar  . Hypertension   . Major depressive disorder   . Nausea   . Seasonal allergies     Patient Active Problem List   Diagnosis Date Noted  . Left knee pain 05/08/2019  . Confusion 05/08/2019  . Closed displaced supracondylar fracture of distal end of left femur without intracondylar extension (HCC) 09/11/2018  . Unspecified atrial fibrillation (HCC) 09/11/2018  . Chronic anticoagulation 09/11/2018  . Essential hypertension 09/11/2018  . Dementia, presenile with depression (HCC) 09/11/2018  . Displaced supracondylar fracture without intracondylar extension of lower end of left femur, initial encounter for closed fracture (HCC) 09/08/2018    Past Surgical History:  Procedure Laterality Date  . FRACTURE SURGERY     left femur  . ORIF FEMUR FRACTURE Left 09/11/2018   Procedure: OPEN REDUCTION INTERNAL FIXATION (ORIF) DISTAL FEMUR FRACTURE;  Surgeon: Sheral Apley, MD;  Location: [facility] OR;  Service: Orthopedics;  Laterality:  Left;     OB History   No obstetric history on file.     Family History  Family history unknown: Yes    Social History   Tobacco Use  . Smoking status: Never Smoker  . Smokeless tobacco: Never Used  Substance Use Topics  . Alcohol use: Not Currently  . Drug use: Never    Home Medications Prior to Admission medications   Medication Sig Start Date End Date Taking? Authorizing Provider  acetaminophen (TYLENOL) 500 MG tablet Take 500 mg by mouth every 6 (six) hours as needed for moderate pain or headache.   Yes [provider]  apixaban (ELIQUIS) 5 MG TABS tablet Take 5 mg by mouth 2 (two) times daily.   Yes [provider]  ARIPiprazole (ABILIFY) 2 MG tablet Take 2 mg by mouth daily.   Yes [provider]  ascorbic acid (VITAMIN C) 500 MG tablet Take 500 mg by mouth daily.   Yes [provider]  cholecalciferol (VITAMIN D3) 25 MCG (1000 UT) tablet Take 1,000 Units by mouth daily.   Yes [provider]  digoxin (LANOXIN) 0.125 MG tablet Take 0.125 mg by mouth daily.   Yes [provider]  fluticasone (FLONASE) 50 MCG/ACT nasal spray Place 1 spray into both nostrils 2 (two) times a day.   Yes [provider]  furosemide (LASIX) 20 MG tablet Take 20 mg by mouth daily. 04/21/19  Yes [provider]  loratadine (CLARITIN) 10 MG  tablet Take 10 mg by mouth at bedtime.   Yes [provider]  LORazepam (ATIVAN) 0.5 MG tablet Take 0.5 mg by mouth daily.   Yes [provider]  Melatonin 3 MG CAPS Take 3 mg by mouth at bedtime.   Yes [provider]  Menthol, Topical Analgesic, (BIOFREEZE) 4 % GEL Apply 1 application topically 3 (three) times daily. Apply to left hip,knee, and thigh   Yes [provider]  metoprolol succinate (TOPROL-XL) 100 MG 24 hr tablet Take 100 mg by mouth daily.    Yes [provider]  Multiple Vitamins-Minerals (CENTRUM PO) Take 1 tablet by mouth daily.    Yes [provider]  nystatin (NYSTATIN) powder Apply 1 g topically 2 (two) times daily. Under breasts   Yes [provider]  polyethylene glycol (MIRALAX / GLYCOLAX) 17 g packet Take 17 g by mouth daily.   Yes [provider]  potassium chloride (MICRO-K) 10 MEQ CR capsule Take 10 mEq by mouth daily. 04/21/19  Yes [provider]  senna (SENOKOT) 8.6 MG TABS tablet Take 1 tablet by mouth at bedtime.   Yes [provider]  sertraline (ZOLOFT) 100 MG tablet Take 300 mg by mouth every other day. 04/15/19  Yes [provider]    Allergies    Patient has no known allergies.  Review of Systems   Review of Systems  Unable to perform ROS: Dementia    Physical Exam Updated Vital Signs BP 135/89 (BP Location: Left Arm)   Pulse 67   Temp 97.7 F (36.5 C) (Oral)   Resp 14   Ht  (1.702 m)   Wt 59 kg   SpO2 94%   BMI 20.36 kg/m   Physical Exam Vitals and nursing note reviewed.  Constitutional:      General: She is not in acute distress.    Appearance: She is well-developed. She is not diaphoretic.  HENT:     Head: Normocephalic and atraumatic.  Eyes:     General: No scleral icterus.       Right eye: No discharge.        Left eye: No discharge.     Conjunctiva/sclera: Conjunctivae normal.  Cardiovascular:     Rate and Rhythm: Normal rate and regular rhythm.     Pulses:          Radial pulses are 1+ on the right side and 1+ on the left side.       Dorsalis pedis pulses are 1+ on the right side and 1+ on the left side.     Heart sounds: Normal heart sounds.  Pulmonary:     Effort: Pulmonary effort is normal. No respiratory distress.     Breath sounds: No stridor.  Abdominal:     General: There is no distension.  Musculoskeletal:        General: No deformity.     Cervical back: Normal range of motion.     Comments: The left knee is generally tender with swelling.  There is no abnormal erythema.  Passive ROM intact.   The  left leg is shortened and externally rotated.    Skin:    General: Skin is warm and dry.  Neurological:     Mental Status: She is alert.     Motor: No abnormal muscle tone.     Comments: Patient is oriented to person, and place however she thinks it is 41.   No facial droop.  Able to squeeze  hands bilaterally and move her toes bilaterally  Psychiatric:        Behavior: Behavior normal.     ED Results / Procedures / Treatments   Labs (all labs ordered are listed, but only abnormal results are displayed) Labs Reviewed  BASIC METABOLIC PANEL - Abnormal; Notable for the following components:      Result Value   Glucose, Bld 119 (*)    Creatinine, Ser 1.10 (*)    GFR calc non Af Amer 44 (*)    GFR calc Af Amer 52 (*)    All other components within normal limits  CBC WITH DIFFERENTIAL/PLATELET - Abnormal; Notable for the following components:   WBC 11.8 (*)    Neutro Abs 9.4 (*)    All other components within normal limits  SARS CORONAVIRUS 2 (TAT 6-24 HRS)  URINE CULTURE  URINALYSIS, ROUTINE W REFLEX MICROSCOPIC    EKG None  Radiology DG Chest 1 View  Result Date: 05/08/2019 CLINICAL DATA:  Fall attempting to get out of bed. EXAM: CHEST  1 VIEW COMPARISON:  None. FINDINGS: The heart is enlarged. Atherosclerotic changes are noted at the aortic arch. Is no edema or effusion. No focal airspace disease is present. Degenerative changes are present in the shoulders, left greater than right. IMPRESSION: 1. Cardiomegaly without failure. 2. No acute cardiopulmonary disease. 3. Aortic atherosclerosis. Electronically Signed   By: Marin Roberts M.D.   On: 05/08/2019 19:01   DG Pelvis 1-2 Views  Result Date: 05/08/2019 CLINICAL DATA:  Unwitnessed fall from bed. EXAM: PELVIS - 1-2 VIEW COMPARISON:  Pelvic and left hip radiographs 02/01/2019. FINDINGS: Status post intramedullary nail and dynamic screw fixation of an intertrochanteric left intertrochanteric femur fracture. The  hardware is intact, but incompletely visualized on these views. The mildly displaced intertrochanteric fracture appears unchanged. No evidence of acute pelvic fracture or dislocation. Mild spondylosis in the lower lumbar spine. IMPRESSION: No acute pelvic findings. Stable appearance of the left femoral intertrochanteric fracture post ORIF. Electronically Signed   By: Carey Bullocks M.D.   On: 05/08/2019 19:05   CT Head Wo Contrast  Result Date: 05/08/2019 CLINICAL DATA:  Head trauma. Unwitnessed fall bed. Difficulty moving right lower extremity. Left periorbital soft tissue swelling and hematoma. The patient is on Eliquis. EXAM: CT HEAD WITHOUT CONTRAST CT MAXILLOFACIAL WITHOUT CONTRAST CT CERVICAL SPINE WITHOUT CONTRAST TECHNIQUE: Multidetector CT imaging of the head, cervical spine, and maxillofacial structures were performed using the standard protocol without intravenous contrast. Multiplanar CT image reconstructions of the cervical spine and maxillofacial structures were also generated. COMPARISON:  None. FINDINGS: CT HEAD FINDINGS Brain: Moderate atrophy and white matter changes are present bilaterally. No acute infarct, hemorrhage, or mass lesion is present. Basal ganglia intact. The ventricles are proportionate to the degree of atrophy. No significant extraaxial fluid collection is present. The brainstem and cerebellum are within normal limits. Vascular: Minimal atherosclerotic changes are present at cavernous internal carotid arteries. There is no hyperdense vessel. Skull: Left periorbital soft tissue hematoma is present. There is no underlying fracture. Calvarium is intact. No other focal soft tissue trauma is present. CT MAXILLOFACIAL FINDINGS Osseous: No acute fractures are present. The mandible is intact and located. Orbits: Left periorbital hematoma is present without underlying fracture. Bilateral lens replacements are noted. Globes and orbits are otherwise unremarkable. Sinuses: The paranasal  sinuses and mastoid air cells are clear. Soft tissues: No other focal soft tissue injury is present. Facial muscles are within normal limits otherwise. CT CERVICAL SPINE FINDINGS Alignment:  No significant listhesis is present. There is straightening of the normal cervical lordosis. Skull base and vertebrae: Craniocervical junction is within normal limits. Mild degenerative changes are noted. Vertebral body heights are maintained. No acute or healing fractures are present. Soft tissues and spinal canal: No prevertebral fluid or swelling. No visible canal hematoma. Disc levels: Multilevel facet degenerative changes are present in the upper cervical spine. Right greater than left uncovertebral spurring is greatest at C5-6 and C6-7. Upper chest: The lung apices are clear. Thoracic inlet is normal. IMPRESSION: 1. Left periorbital soft tissue hematoma without underlying fracture. 2. No other focal soft tissue trauma. 3. Normal CT appearance of the brain for age. 4. No acute osseous injury to the face. 5. Multilevel degenerative changes of the cervical spine without acute fracture or traumatic subluxation. Electronically Signed   By: San Morelle M.D.   On: 05/08/2019 18:41   CT Cervical Spine Wo Contrast  Result Date: 05/08/2019 CLINICAL DATA:  Head trauma. Unwitnessed fall bed. Difficulty moving right lower extremity. Left periorbital soft tissue swelling and hematoma. The patient is on Eliquis. EXAM: CT HEAD WITHOUT CONTRAST CT MAXILLOFACIAL WITHOUT CONTRAST CT CERVICAL SPINE WITHOUT CONTRAST TECHNIQUE: Multidetector CT imaging of the head, cervical spine, and maxillofacial structures were performed using the standard protocol without intravenous contrast. Multiplanar CT image reconstructions of the cervical spine and maxillofacial structures were also generated. COMPARISON:  None. FINDINGS: CT HEAD FINDINGS Brain: Moderate atrophy and white matter changes are present bilaterally. No acute infarct,  hemorrhage, or mass lesion is present. Basal ganglia intact. The ventricles are proportionate to the degree of atrophy. No significant extraaxial fluid collection is present. The brainstem and cerebellum are within normal limits. Vascular: Minimal atherosclerotic changes are present at cavernous internal carotid arteries. There is no hyperdense vessel. Skull: Left periorbital soft tissue hematoma is present. There is no underlying fracture. Calvarium is intact. No other focal soft tissue trauma is present. CT MAXILLOFACIAL FINDINGS Osseous: No acute fractures are present. The mandible is intact and located. Orbits: Left periorbital hematoma is present without underlying fracture. Bilateral lens replacements are noted. Globes and orbits are otherwise unremarkable. Sinuses: The paranasal sinuses and mastoid air cells are clear. Soft tissues: No other focal soft tissue injury is present. Facial muscles are within normal limits otherwise. CT CERVICAL SPINE FINDINGS Alignment: No significant listhesis is present. There is straightening of the normal cervical lordosis. Skull base and vertebrae: Craniocervical junction is within normal limits. Mild degenerative changes are noted. Vertebral body heights are maintained. No acute or healing fractures are present. Soft tissues and spinal canal: No prevertebral fluid or swelling. No visible canal hematoma. Disc levels: Multilevel facet degenerative changes are present in the upper cervical spine. Right greater than left uncovertebral spurring is greatest at C5-6 and C6-7. Upper chest: The lung apices are clear. Thoracic inlet is normal. IMPRESSION: 1. Left periorbital soft tissue hematoma without underlying fracture. 2. No other focal soft tissue trauma. 3. Normal CT appearance of the brain for age. 4. No acute osseous injury to the face. 5. Multilevel degenerative changes of the cervical spine without acute fracture or traumatic subluxation. Electronically Signed   By:  San Morelle M.D.   On: 05/08/2019 18:41   CT Knee Left Wo Contrast  Result Date: 05/08/2019 CLINICAL DATA:  85 year old female with left knee fracture. EXAM: CT OF THE left KNEE WITHOUT CONTRAST TECHNIQUE: Multidetector CT imaging of the left knee was performed according to the standard protocol. Multiplanar CT image reconstructions were also  generated. COMPARISON:  Left knee radiograph dated 05/08/2019 and 02/01/2019. FINDINGS: Evaluation is limited due to streak artifact caused by metallic fixation hardware. Bones/Joint/Cartilage There is no acute fracture or dislocation. Focal area of indentation of the anterior aspect of the distal femur similar to prior radiograph. There is advanced osteopenia and osteoarthritic changes of the knee. There is a small suprapatellar effusion. Ligaments Suboptimally assessed by CT. Muscles and Tendons No acute findings. Soft tissues There is diffuse skin thickening and subcutaneous soft tissue edema of the lateral aspect of the knee. No fluid collection. Clinical correlation is recommended to evaluate for cellulitis or an infectious process. IMPRESSION: 1. No acute fracture or dislocation. 2. Advanced osteopenia and osteoarthritic changes of the knee. 3. Small suprapatellar effusion. 4. Skin thickening and subcutaneous soft tissue edema of the lateral aspect of the knee. Clinical correlation is recommended to evaluate for cellulitis or an infectious process. Electronically Signed   By: Elgie CollardArash  Radparvar M.D.   On: 05/08/2019 20:37   DG FEMUR MIN 2 VIEWS LEFT  Result Date: 05/08/2019 CLINICAL DATA:  Unwitnessed fall from bed, bruising and abrasion LEFT knee EXAM: LEFT FEMUR 2 VIEWS COMPARISON:  LEFT knee 02/01/2019 FINDINGS: Osseous demineralization. IM nail across a prior intertrochanteric fracture LEFT femur. Visualized pelvis intact. Lateral plate, multiple screws, and a cerclage wire identified across a previously identified distal LEFT femoral metaphyseal  fracture. Hardware intact. Diffuse joint space narrowing LEFT knee. No new fracture, dislocation, or bone destruction. IMPRESSION: Post ORIF of previous proximal and distal LEFT femoral fractures. No new osseous abnormalities identified. Osseous demineralization with degenerative changes LEFT knee. Electronically Signed   By: Ulyses SouthwardMark  Boles M.D.   On: 05/08/2019 19:01   CT Maxillofacial WO CM  Result Date: 05/08/2019 CLINICAL DATA:  Head trauma. Unwitnessed fall bed. Difficulty moving right lower extremity. Left periorbital soft tissue swelling and hematoma. The patient is on Eliquis. EXAM: CT HEAD WITHOUT CONTRAST CT MAXILLOFACIAL WITHOUT CONTRAST CT CERVICAL SPINE WITHOUT CONTRAST TECHNIQUE: Multidetector CT imaging of the head, cervical spine, and maxillofacial structures were performed using the standard protocol without intravenous contrast. Multiplanar CT image reconstructions of the cervical spine and maxillofacial structures were also generated. COMPARISON:  None. FINDINGS: CT HEAD FINDINGS Brain: Moderate atrophy and white matter changes are present bilaterally. No acute infarct, hemorrhage, or mass lesion is present. Basal ganglia intact. The ventricles are proportionate to the degree of atrophy. No significant extraaxial fluid collection is present. The brainstem and cerebellum are within normal limits. Vascular: Minimal atherosclerotic changes are present at cavernous internal carotid arteries. There is no hyperdense vessel. Skull: Left periorbital soft tissue hematoma is present. There is no underlying fracture. Calvarium is intact. No other focal soft tissue trauma is present. CT MAXILLOFACIAL FINDINGS Osseous: No acute fractures are present. The mandible is intact and located. Orbits: Left periorbital hematoma is present without underlying fracture. Bilateral lens replacements are noted. Globes and orbits are otherwise unremarkable. Sinuses: The paranasal sinuses and mastoid air cells are clear.  Soft tissues: No other focal soft tissue injury is present. Facial muscles are within normal limits otherwise. CT CERVICAL SPINE FINDINGS Alignment: No significant listhesis is present. There is straightening of the normal cervical lordosis. Skull base and vertebrae: Craniocervical junction is within normal limits. Mild degenerative changes are noted. Vertebral body heights are maintained. No acute or healing fractures are present. Soft tissues and spinal canal: No prevertebral fluid or swelling. No visible canal hematoma. Disc levels: Multilevel facet degenerative changes are present in the upper cervical  spine. Right greater than left uncovertebral spurring is greatest at C5-6 and C6-7. Upper chest: The lung apices are clear. Thoracic inlet is normal. IMPRESSION: 1. Left periorbital soft tissue hematoma without underlying fracture. 2. No other focal soft tissue trauma. 3. Normal CT appearance of the brain for age. 4. No acute osseous injury to the face. 5. Multilevel degenerative changes of the cervical spine without acute fracture or traumatic subluxation. Electronically Signed   By: Marin Roberts M.D.   On: 05/08/2019 18:41    Procedures Procedures (including critical care time)  Medications Ordered in ED Medications    ED Course  I have reviewed the triage vital signs and the nursing notes.  Pertinent labs & imaging results that were available during my care of the patient were reviewed by me and considered in my medical decision making (see chart for details).  Clinical Course as of May 07 2310  Fri May 08, 2019  2057 Spoke with Dr. Julian Reil for admission.    [EH]    Clinical Course User Index [EH] Norman Clay   MDM Rules/Calculators/A&P                     Patient presents today for evaluation after a fall.  She reports that she was sitting on the side of her bed to get up to walk to the bathroom when her slippery socks caused her to fall.  CT head and neck were  obtained without evidence of injury or acute abnormality.  Plain films of the left knee, pelvis, chest, and left femur were obtained without evidence of acute fracture.  Patient reports she is ambulatory, attempted to ambulate patient, however she was unable to ambulate. Dr. Julian Reil was consulted for admission.  In discussions with Dr. Julian Reil and patient's family it appears that patient does not actually ambulate and has not ambulated for many months.  She is also had confusion regarding her mother not being alive stating that she needs to get home quickly to see her mother.  This is new over the past 2 days.  Given this apparent confusion patient will be admitted.  UA ordered.    Patient remained hemodynamically stable in my care.    Final Clinical Impression(s) / ED Diagnoses Final diagnoses:  Fall    Rx / DC Orders ED Discharge Orders    None       Norman Clay 05/08/19 2312    Jacalyn Lefevre, MD 05/08/19 2354

## 2019-05-09 ENCOUNTER — Encounter (HOSPITAL_COMMUNITY): Payer: Self-pay | Admitting: Internal Medicine

## 2019-05-09 ENCOUNTER — Other Ambulatory Visit: Payer: Self-pay

## 2019-05-09 DIAGNOSIS — W19XXXD Unspecified fall, subsequent encounter: Secondary | ICD-10-CM | POA: Diagnosis not present

## 2019-05-09 DIAGNOSIS — I10 Essential (primary) hypertension: Secondary | ICD-10-CM | POA: Diagnosis not present

## 2019-05-09 DIAGNOSIS — I4891 Unspecified atrial fibrillation: Secondary | ICD-10-CM | POA: Diagnosis not present

## 2019-05-09 LAB — MRSA PCR SCREENING: MRSA by PCR: NEGATIVE

## 2019-05-09 LAB — SARS CORONAVIRUS 2 (TAT 6-24 HRS): SARS Coronavirus 2: NEGATIVE

## 2019-05-09 MED ORDER — ENSURE ENLIVE PO LIQD
237.0000 mL | Freq: Two times a day (BID) | ORAL | Status: DC
  Administered 2019-05-09 – 2019-05-11 (×5): 237 mL via ORAL

## 2019-05-09 MED ORDER — APIXABAN 2.5 MG PO TABS
2.5000 mg | ORAL_TABLET | Freq: Two times a day (BID) | ORAL | Status: DC
  Administered 2019-05-09 – 2019-05-11 (×4): 2.5 mg via ORAL
  Filled 2019-05-09 (×4): qty 1

## 2019-05-09 NOTE — Evaluation (Signed)
Physical Therapy Evaluation Patient Details Name: Veronica Flowers MRN: [id number] DOB: [birth year] Today's Date: 05/09/2019   History of Present Illness  Pt is an 85 y/o female admitted secondary to a fall at her ALF. Imaging was negative for any acute fractures. PMH including but not limited to a-fib, dementia, HTN and history of multiple falls.    Clinical Impression  Pt presented supine in bed with HOB elevated, awake and willing to participate in therapy session. Pt's daughter in law was present throughout session as well and provided history information. Prior to admission, pt was at [facility] where she required physical assistance of two staff members for all transfers and was dependent for ADLs. Pt's daughter in law reporting that pt has been to a few different SNFs for rehab and did not really participate or make any progress there. Pt very unmotivated to move or walk again. At the time of evaluation, pt agreeable to sit EOB with mod-max A. She became fatigued quickly and requested to lie back down after ~5 minutes. Pt's family does not wish to pursue any further rehab for the pt at this time and would like for her to return to her ALF. No further acute PT needs identified at this time. PT signing off.     Follow Up Recommendations No PT follow up;Other (comment)(pt's family doesn't want to pursue further rehab;back to ALF)    Equipment Recommendations  None recommended by PT    Recommendations for Other Services       Precautions / Restrictions Precautions Precautions: Fall Restrictions Weight Bearing Restrictions: No      Mobility  Bed Mobility Overal bed mobility: Needs Assistance Bed Mobility: Supine to Sit;Sit to Supine     Supine to sit: Max assist Sit to supine: Mod assist   General bed mobility comments: increased time and effort, multimodal cueing to complete task, pt able to move R LE towards EOB, assistance needed to move L LE off of bed and for trunk  elevation with use of bed pads to position pt's hips at EOB; mod A needed for return of bilateral LEs onto bed  Transfers                 General transfer comment: pt refusing to attempt at time of eval  Ambulation/Gait                Stairs            Wheelchair Mobility    Modified Rankin (Stroke Patients Only)       Balance Overall balance assessment: Needs assistance Sitting-balance support: Feet supported;Bilateral upper extremity supported;Single extremity supported Sitting balance-Leahy Scale: Poor Sitting balance - Comments: R lateral lean, required min-max A to maintain upright sitting at EOB Postural control: Right lateral lean                                   Pertinent Vitals/Pain Pain Assessment: Faces Faces Pain Scale: Hurts even more Pain Location: mid back and headache Pain Descriptors / Indicators: Grimacing;Guarding Pain Intervention(s): Monitored during session;Repositioned;Patient requesting pain meds-RN notified    Home Living Family/patient expects to be discharged to:: Other (Comment)                 Additional Comments: pt from [facility]    Prior Function Level of Independence: Needs assistance   Gait / Transfers Assistance Needed: per daughter-in-law, pt required two  person physical assistance for all transfers and used a w/c for mobility (could not propel herself)  ADL's / Homemaking Assistance Needed: requires assistance from staff        Hand Dominance        Extremity/Trunk Assessment   Upper Extremity Assessment Upper Extremity Assessment: Generalized weakness;Difficult to assess due to impaired cognition    Lower Extremity Assessment Lower Extremity Assessment: Generalized weakness;Difficult to assess due to impaired cognition    Cervical / Trunk Assessment Cervical / Trunk Assessment: Kyphotic  Communication   Communication: No difficulties  Cognition Arousal/Alertness:  Awake/alert Behavior During Therapy: Flat affect Overall Cognitive Status: Impaired/Different from baseline Area of Impairment: Orientation;Attention;Memory;Following commands;Safety/judgement;Awareness;Problem solving                 Orientation Level: Disoriented to;Situation Current Attention Level: Selective Memory: Decreased recall of precautions;Decreased short-term memory Following Commands: Follows one step commands with increased time;Follows one step commands inconsistently Safety/Judgement: Decreased awareness of deficits;Decreased awareness of safety Awareness: Intellectual Problem Solving: Slow processing;Decreased initiation;Requires verbal cues        General Comments      Exercises     Assessment/Plan    PT Assessment Patient needs continued PT services  PT Problem List Decreased strength;Decreased range of motion;Decreased activity tolerance;Decreased balance;Decreased mobility;Decreased coordination;Decreased cognition;Decreased knowledge of use of DME;Decreased safety awareness;Decreased knowledge of precautions;Pain       PT Treatment Interventions DME instruction;Functional mobility training;Therapeutic activities;Therapeutic exercise;Balance training;Neuromuscular re-education;Cognitive remediation;Patient/family education    PT Goals (Current goals can be found in the Care Plan section)  Acute Rehab PT Goals Patient Stated Goal: lay in bed, decrease pain PT Goal Formulation: All assessment and education complete, DC therapy    Frequency Min 2X/week   Barriers to discharge        Co-evaluation               AM-PAC PT "6 Clicks" Mobility  Outcome Measure Help needed turning from your back to your side while in a flat bed without using bedrails?: Total Help needed moving from lying on your back to sitting on the side of a flat bed without using bedrails?: Total Help needed moving to and from a bed to a chair (including a wheelchair)?:  Total Help needed standing up from a chair using your arms (e.g., wheelchair or bedside chair)?: Total Help needed to walk in hospital room?: Total Help needed climbing 3-5 steps with a railing? : Total 6 Click Score: 6    End of Session   Activity Tolerance: Patient limited by fatigue;Patient limited by pain Patient left: in bed;with call bell/phone within reach;with bed alarm set Nurse Communication: Mobility status PT Visit Diagnosis: Other abnormalities of gait and mobility (R26.89);Pain Pain - Right/Left: Left Pain - part of body: Knee    Time: 1203-1229 PT Time Calculation (min) (ACUTE ONLY): 26 min   Charges:   PT Evaluation $PT Eval Moderate Complexity: 1 Mod PT Treatments $Therapeutic Activity: 8-22 mins        Anastasio Champion, DPT  Acute Rehabilitation Services Pager [phone] Office [place] 05/09/2019, 1:58 PM

## 2019-05-09 NOTE — Progress Notes (Signed)
Initial Nutrition Assessment   RD working remotely.  DOCUMENTATION CODES:   Not applicable  INTERVENTION:  Continue Ensure Enlive po BID, each supplement provides 350 kcal and 20 grams of protein  Encourage adequate PO intake.   NUTRITION DIAGNOSIS:   Increased nutrient needs related to acute illness as evidenced by estimated needs.  GOAL:   Patient will meet greater than or equal to 90% of their needs  MONITOR:   PO intake, Supplement acceptance, Skin, Weight trends, Labs, I & O's  REASON FOR ASSESSMENT:   Malnutrition Screening Tool    ASSESSMENT:   85 year old lady with prior h/o Atrial fibrillation on Eliquis, hypertension, dementia, presents with mechanical fall and knee pain. X-rays do not show any fractures at this time.  RD unable to obtain pt nutrition history at this time. Meal completion has been 50-80%. Noted per weight records pt with a 21% weight loss in 8 months which is significant for time frame. Pt currently has Ensure ordered and has been consuming them. RD to continue with current orders to aid in caloric and protein needs.   Unable to complete Nutrition-Focused physical exam at this time.   Labs and medications reviewed.   Diet Order:   Diet Order            Diet Heart Room service appropriate? Yes; Fluid consistency: Thin  Diet effective now              EDUCATION NEEDS:   Not appropriate for education at this time  Skin:  Skin Assessment: Reviewed RN Assessment  Last BM:  12/24  Height:   Ht Readings from Last 1 Encounters:  05/08/19 5\' 7"  (1.702 m)    Weight:   Wt Readings from Last 1 Encounters:  05/08/19 58.9 kg    Ideal Body Weight:  61.36 kg  BMI:  Body mass index is 20.34 kg/m.  Estimated Nutritional Needs:   Kcal:  1450-1650  Protein:  65-75 grams  Fluid:  >/= 1.5 L/day    Corrin Parker, MS, RD, LDN Pager # [phone] After hours/ weekend pager # [phone]

## 2019-05-09 NOTE — Plan of Care (Signed)
  Problem: Activity: Goal: Risk for activity intolerance will decrease Outcome: Progressing   Problem: Coping: Goal: Level of anxiety will decrease Outcome: Progressing   Problem: Pain Managment: Goal: General experience of comfort will improve Outcome: Progressing   Problem: Safety: Goal: Ability to remain free from injury will improve Outcome: Progressing   Problem: Skin Integrity: Goal: Risk for impaired skin integrity will decrease Outcome: Progressing   

## 2019-05-09 NOTE — Progress Notes (Signed)
PROGRESS NOTE    Veronica Flowers  ONG:[id number] DOB: 1928/05/01 DOA: 05/08/2019 PCP: System, Pcp Not In    Brief Narrative:  85 year old lady with prior h/o Atrial fibrillation on Eliquis, hypertension, dementia, presents with mechanical fall and knee pain.   Assessment & Plan:   Principal Problem:   Confusion Active Problems:   Unspecified atrial fibrillation (HCC)   Essential hypertension   Left knee pain   Multiple falls with knee pain on ambulation. Probably secondary to deconditioning and worsening of her dementia. X-rays do not show any fractures at this time. Patient is afebrile and mild leukocytosis, no signs of infection at this time. Urine analysis is negative urine cultures are pending, chest x-ray does not show any pneumonia or fluid at this time COVID-19 is negative.  Paroxysmal atrial fibrillation Rate controlled and on Eliquis for anticoagulation.  Essential hypertension:  Well controlled.    Left knee pain:  Pain control.      DVT prophylaxis: Scd's Code Status: full code Family Communication: discussed with daughter in law and son.  Disposition Plan: discharge to [facility]   Consultants:  None.   Procedures: None.   Antimicrobials:None.  Subjective: No new complaints.   Objective: Vitals:   05/09/19 0600 05/09/19 0723 05/09/19 0726 05/09/19 0942  BP:   (!) 126/53 126/63  Pulse:   62 62  Resp: 16  16   Temp:  98.7 F (37.1 C) (!) 97.3 F (36.3 C)   TempSrc:  Oral Oral   SpO2:   98%   Weight:      Height:        Intake/Output Summary (Last 24 hours) at 05/09/2019 1324 Last data filed at 05/09/2019 0400 Gross per 24 hour  Intake 170 ml  Output 300 ml  Net -130 ml   Filed Weights   05/08/19 1747 05/08/19 2243  Weight: 59 kg 58.9 kg    Examination:  General exam: Appears calm and comfortable  Respiratory system: Clear to auscultation. Respiratory effort normal. Cardiovascular system: S1 & S2 heard, irregular,  No  JVD,No pedal edema. Gastrointestinal system: Abdomen is nondistended, soft and nontender. Normal bowel sounds heard. Central nervous system: Alert and oriented. No focal neurological deficits. Extremities: Symmetric 5 x 5 power. Skin: No rashes, lesions or ulcers Psychiatry: Mood & affect appropriate.     Data Reviewed: I have personally reviewed following labs and imaging studies  CBC: Recent Labs  Lab 05/08/19 1935  WBC 11.8*  NEUTROABS 9.4*  HGB 13.3  HCT 40.4  MCV 98.1  PLT 280   Basic Metabolic Panel: Recent Labs  Lab 05/08/19 1935  NA 136  K 4.3  CL 103  CO2 26  GLUCOSE 119*  BUN 13  CREATININE 1.10*  CALCIUM 9.1   GFR: Estimated Creatinine Clearance: 32.2 mL/min (A) (by C-G formula based on SCr of 1.1 mg/dL (H)). Liver Function Tests: No results for input(s): AST, ALT, ALKPHOS, BILITOT, PROT, ALBUMIN in the last 168 hours. No results for input(s): LIPASE, AMYLASE in the last 168 hours. No results for input(s): AMMONIA in the last 168 hours. Coagulation Profile: No results for input(s): INR, PROTIME in the last 168 hours. Cardiac Enzymes: No results for input(s): CKTOTAL, CKMB, CKMBINDEX, TROPONINI in the last 168 hours. BNP (last 3 results) No results for input(s): PROBNP in the last 8760 hours. HbA1C: No results for input(s): HGBA1C in the last 72 hours. CBG: No results for input(s): GLUCAP in the last 168 hours. Lipid Profile: No results for  input(s): CHOL, HDL, LDLCALC, TRIG, CHOLHDL, LDLDIRECT in the last 72 hours. Thyroid Function Tests: No results for input(s): TSH, T4TOTAL, FREET4, T3FREE, THYROIDAB in the last 72 hours. Anemia Panel: No results for input(s): VITAMINB12, FOLATE, FERRITIN, TIBC, IRON, RETICCTPCT in the last 72 hours. Sepsis Labs: No results for input(s): PROCALCITON, LATICACIDVEN in the last 168 hours.  Recent Results (from the past 240 hour(s))  SARS CORONAVIRUS 2 (TAT 6-24 HRS) Nasopharyngeal Nasopharyngeal Swab     Status:  None   Collection Time: 05/08/19  7:38 PM   Specimen: Nasopharyngeal Swab  Result Value Ref Range Status   SARS Coronavirus 2 NEGATIVE NEGATIVE Final    Comment: (NOTE) SARS-CoV-2 target nucleic acids are NOT DETECTED. The SARS-CoV-2 RNA is generally detectable in upper and lower respiratory specimens during the acute phase of infection. Negative results do not preclude SARS-CoV-2 infection, do not rule out co-infections with other pathogens, and should not be used as the sole basis for treatment or other patient management decisions. Negative results must be combined with clinical observations, patient history, and epidemiological information. The expected result is Negative. Fact Sheet for Patients: SugarRoll.be Fact Sheet for Healthcare Providers: https://www.woods-mathews.com/ This test is not yet approved or cleared by the [place] FDA and  has been authorized for detection and/or diagnosis of SARS-CoV-2 by FDA under an Emergency Use Authorization (EUA). This EUA will remain  in effect (meaning this test can be used) for the duration of the COVID-19 declaration under Section 56 4(b)(1) of the Act, 21 U.S.C. section 360bbb-3(b)(1), unless the authorization is terminated or revoked sooner. Performed at [facility], [place] [address], [place], [place] [address]   MRSA PCR Screening     Status: None   Collection Time: 05/08/19 11:17 PM   Specimen: Nasopharyngeal  Result Value Ref Range Status   MRSA by PCR NEGATIVE NEGATIVE Final    Comment:        The GeneXpert MRSA Assay (FDA approved for NASAL specimens only), is one component of a comprehensive MRSA colonization surveillance program. It is not intended to diagnose MRSA infection nor to guide or monitor treatment for MRSA infections. Performed at [facility], [place] [address], [place], [place] [address]          Radiology Studies: DG Chest 1 View  Result  Date: 05/08/2019 CLINICAL DATA:  Fall attempting to get out of bed. EXAM: CHEST  1 VIEW COMPARISON:  None. FINDINGS: The heart is enlarged. Atherosclerotic changes are noted at the aortic arch. Is no edema or effusion. No focal airspace disease is present. Degenerative changes are present in the shoulders, left greater than right. IMPRESSION: 1. Cardiomegaly without failure. 2. No acute cardiopulmonary disease. 3. Aortic atherosclerosis. Electronically Signed   By: San Morelle M.D.   On: 05/08/2019 19:01   DG Pelvis 1-2 Views  Result Date: 05/08/2019 CLINICAL DATA:  Unwitnessed fall from bed. EXAM: PELVIS - 1-2 VIEW COMPARISON:  Pelvic and left hip radiographs 02/01/2019. FINDINGS: Status post intramedullary nail and dynamic screw fixation of an intertrochanteric left intertrochanteric femur fracture. The hardware is intact, but incompletely visualized on these views. The mildly displaced intertrochanteric fracture appears unchanged. No evidence of acute pelvic fracture or dislocation. Mild spondylosis in the lower lumbar spine. IMPRESSION: No acute pelvic findings. Stable appearance of the left femoral intertrochanteric fracture post ORIF. Electronically Signed   By: Richardean Sale M.D.   On: 05/08/2019 19:05   CT Head Wo Contrast  Result Date: 05/08/2019 CLINICAL  DATA:  Head trauma. Unwitnessed fall bed. Difficulty moving right lower extremity. Left periorbital soft tissue swelling and hematoma. The patient is on Eliquis. EXAM: CT HEAD WITHOUT CONTRAST CT MAXILLOFACIAL WITHOUT CONTRAST CT CERVICAL SPINE WITHOUT CONTRAST TECHNIQUE: Multidetector CT imaging of the head, cervical spine, and maxillofacial structures were performed using the standard protocol without intravenous contrast. Multiplanar CT image reconstructions of the cervical spine and maxillofacial structures were also generated. COMPARISON:  None. FINDINGS: CT HEAD FINDINGS Brain: Moderate atrophy and white matter changes are  present bilaterally. No acute infarct, hemorrhage, or mass lesion is present. Basal ganglia intact. The ventricles are proportionate to the degree of atrophy. No significant extraaxial fluid collection is present. The brainstem and cerebellum are within normal limits. Vascular: Minimal atherosclerotic changes are present at cavernous internal carotid arteries. There is no hyperdense vessel. Skull: Left periorbital soft tissue hematoma is present. There is no underlying fracture. Calvarium is intact. No other focal soft tissue trauma is present. CT MAXILLOFACIAL FINDINGS Osseous: No acute fractures are present. The mandible is intact and located. Orbits: Left periorbital hematoma is present without underlying fracture. Bilateral lens replacements are noted. Globes and orbits are otherwise unremarkable. Sinuses: The paranasal sinuses and mastoid air cells are clear. Soft tissues: No other focal soft tissue injury is present. Facial muscles are within normal limits otherwise. CT CERVICAL SPINE FINDINGS Alignment: No significant listhesis is present. There is straightening of the normal cervical lordosis. Skull base and vertebrae: Craniocervical junction is within normal limits. Mild degenerative changes are noted. Vertebral body heights are maintained. No acute or healing fractures are present. Soft tissues and spinal canal: No prevertebral fluid or swelling. No visible canal hematoma. Disc levels: Multilevel facet degenerative changes are present in the upper cervical spine. Right greater than left uncovertebral spurring is greatest at C5-6 and C6-7. Upper chest: The lung apices are clear. Thoracic inlet is normal. IMPRESSION: 1. Left periorbital soft tissue hematoma without underlying fracture. 2. No other focal soft tissue trauma. 3. Normal CT appearance of the brain for age. 4. No acute osseous injury to the face. 5. Multilevel degenerative changes of the cervical spine without acute fracture or traumatic  subluxation. Electronically Signed   By: Marin Robertshristopher  Mattern M.D.   On: 05/08/2019 18:41   CT Cervical Spine Wo Contrast  Result Date: 05/08/2019 CLINICAL DATA:  Head trauma. Unwitnessed fall bed. Difficulty moving right lower extremity. Left periorbital soft tissue swelling and hematoma. The patient is on Eliquis. EXAM: CT HEAD WITHOUT CONTRAST CT MAXILLOFACIAL WITHOUT CONTRAST CT CERVICAL SPINE WITHOUT CONTRAST TECHNIQUE: Multidetector CT imaging of the head, cervical spine, and maxillofacial structures were performed using the standard protocol without intravenous contrast. Multiplanar CT image reconstructions of the cervical spine and maxillofacial structures were also generated. COMPARISON:  None. FINDINGS: CT HEAD FINDINGS Brain: Moderate atrophy and white matter changes are present bilaterally. No acute infarct, hemorrhage, or mass lesion is present. Basal ganglia intact. The ventricles are proportionate to the degree of atrophy. No significant extraaxial fluid collection is present. The brainstem and cerebellum are within normal limits. Vascular: Minimal atherosclerotic changes are present at cavernous internal carotid arteries. There is no hyperdense vessel. Skull: Left periorbital soft tissue hematoma is present. There is no underlying fracture. Calvarium is intact. No other focal soft tissue trauma is present. CT MAXILLOFACIAL FINDINGS Osseous: No acute fractures are present. The mandible is intact and located. Orbits: Left periorbital hematoma is present without underlying fracture. Bilateral lens replacements are noted. Globes and orbits are otherwise unremarkable.  Sinuses: The paranasal sinuses and mastoid air cells are clear. Soft tissues: No other focal soft tissue injury is present. Facial muscles are within normal limits otherwise. CT CERVICAL SPINE FINDINGS Alignment: No significant listhesis is present. There is straightening of the normal cervical lordosis. Skull base and vertebrae:  Craniocervical junction is within normal limits. Mild degenerative changes are noted. Vertebral body heights are maintained. No acute or healing fractures are present. Soft tissues and spinal canal: No prevertebral fluid or swelling. No visible canal hematoma. Disc levels: Multilevel facet degenerative changes are present in the upper cervical spine. Right greater than left uncovertebral spurring is greatest at C5-6 and C6-7. Upper chest: The lung apices are clear. Thoracic inlet is normal. IMPRESSION: 1. Left periorbital soft tissue hematoma without underlying fracture. 2. No other focal soft tissue trauma. 3. Normal CT appearance of the brain for age. 4. No acute osseous injury to the face. 5. Multilevel degenerative changes of the cervical spine without acute fracture or traumatic subluxation. Electronically Signed   By: Marin Roberts M.D.   On: 05/08/2019 18:41   CT Knee Left Wo Contrast  Result Date: 05/08/2019 CLINICAL DATA:  85 year old female with left knee fracture. EXAM: CT OF THE left KNEE WITHOUT CONTRAST TECHNIQUE: Multidetector CT imaging of the left knee was performed according to the standard protocol. Multiplanar CT image reconstructions were also generated. COMPARISON:  Left knee radiograph dated 05/08/2019 and 02/01/2019. FINDINGS: Evaluation is limited due to streak artifact caused by metallic fixation hardware. Bones/Joint/Cartilage There is no acute fracture or dislocation. Focal area of indentation of the anterior aspect of the distal femur similar to prior radiograph. There is advanced osteopenia and osteoarthritic changes of the knee. There is a small suprapatellar effusion. Ligaments Suboptimally assessed by CT. Muscles and Tendons No acute findings. Soft tissues There is diffuse skin thickening and subcutaneous soft tissue edema of the lateral aspect of the knee. No fluid collection. Clinical correlation is recommended to evaluate for cellulitis or an infectious process.  IMPRESSION: 1. No acute fracture or dislocation. 2. Advanced osteopenia and osteoarthritic changes of the knee. 3. Small suprapatellar effusion. 4. Skin thickening and subcutaneous soft tissue edema of the lateral aspect of the knee. Clinical correlation is recommended to evaluate for cellulitis or an infectious process. Electronically Signed   By: Elgie Collard M.D.   On: 05/08/2019 20:37   DG FEMUR MIN 2 VIEWS LEFT  Result Date: 05/08/2019 CLINICAL DATA:  Unwitnessed fall from bed, bruising and abrasion LEFT knee EXAM: LEFT FEMUR 2 VIEWS COMPARISON:  LEFT knee 02/01/2019 FINDINGS: Osseous demineralization. IM nail across a prior intertrochanteric fracture LEFT femur. Visualized pelvis intact. Lateral plate, multiple screws, and a cerclage wire identified across a previously identified distal LEFT femoral metaphyseal fracture. Hardware intact. Diffuse joint space narrowing LEFT knee. No new fracture, dislocation, or bone destruction. IMPRESSION: Post ORIF of previous proximal and distal LEFT femoral fractures. No new osseous abnormalities identified. Osseous demineralization with degenerative changes LEFT knee. Electronically Signed   By: Ulyses Southward M.D.   On: 05/08/2019 19:01   CT Maxillofacial WO CM  Result Date: 05/08/2019 CLINICAL DATA:  Head trauma. Unwitnessed fall bed. Difficulty moving right lower extremity. Left periorbital soft tissue swelling and hematoma. The patient is on Eliquis. EXAM: CT HEAD WITHOUT CONTRAST CT MAXILLOFACIAL WITHOUT CONTRAST CT CERVICAL SPINE WITHOUT CONTRAST TECHNIQUE: Multidetector CT imaging of the head, cervical spine, and maxillofacial structures were performed using the standard protocol without intravenous contrast. Multiplanar CT image reconstructions of the cervical  spine and maxillofacial structures were also generated. COMPARISON:  None. FINDINGS: CT HEAD FINDINGS Brain: Moderate atrophy and white matter changes are present bilaterally. No acute infarct,  hemorrhage, or mass lesion is present. Basal ganglia intact. The ventricles are proportionate to the degree of atrophy. No significant extraaxial fluid collection is present. The brainstem and cerebellum are within normal limits. Vascular: Minimal atherosclerotic changes are present at cavernous internal carotid arteries. There is no hyperdense vessel. Skull: Left periorbital soft tissue hematoma is present. There is no underlying fracture. Calvarium is intact. No other focal soft tissue trauma is present. CT MAXILLOFACIAL FINDINGS Osseous: No acute fractures are present. The mandible is intact and located. Orbits: Left periorbital hematoma is present without underlying fracture. Bilateral lens replacements are noted. Globes and orbits are otherwise unremarkable. Sinuses: The paranasal sinuses and mastoid air cells are clear. Soft tissues: No other focal soft tissue injury is present. Facial muscles are within normal limits otherwise. CT CERVICAL SPINE FINDINGS Alignment: No significant listhesis is present. There is straightening of the normal cervical lordosis. Skull base and vertebrae: Craniocervical junction is within normal limits. Mild degenerative changes are noted. Vertebral body heights are maintained. No acute or healing fractures are present. Soft tissues and spinal canal: No prevertebral fluid or swelling. No visible canal hematoma. Disc levels: Multilevel facet degenerative changes are present in the upper cervical spine. Right greater than left uncovertebral spurring is greatest at C5-6 and C6-7. Upper chest: The lung apices are clear. Thoracic inlet is normal. IMPRESSION: 1. Left periorbital soft tissue hematoma without underlying fracture. 2. No other focal soft tissue trauma. 3. Normal CT appearance of the brain for age. 4. No acute osseous injury to the face. 5. Multilevel degenerative changes of the cervical spine without acute fracture or traumatic subluxation. Electronically Signed   By:  Marin Roberts M.D.   On: 05/08/2019 18:41        Scheduled Meds: . apixaban  2.5 mg Oral BID  . ARIPiprazole  2 mg Oral Daily  . ascorbic acid  500 mg Oral Daily  . cholecalciferol  1,000 Units Oral Daily  . digoxin  0.125 mg Oral Daily  . feeding supplement (ENSURE ENLIVE)  237 mL Oral BID BM  . fluticasone  1 spray Each Nare Daily  . furosemide  20 mg Oral Daily  . loratadine  10 mg Oral QHS  . LORazepam  0.5 mg Oral Daily  . Melatonin  3 mg Oral QHS  . metoprolol succinate  100 mg Oral Daily  . Muscle Rub  1 application Topical TID  . nystatin  1 g Topical BID  . polyethylene glycol  17 g Oral Daily  . potassium chloride  10 mEq Oral Daily  . senna  1 tablet Oral QHS  . sertraline  300 mg Oral QODAY   Continuous Infusions:   LOS: 0 days       Kathlen Mody, MD [facility]

## 2019-05-10 DIAGNOSIS — R410 Disorientation, unspecified: Secondary | ICD-10-CM | POA: Diagnosis not present

## 2019-05-10 LAB — URINE CULTURE

## 2019-05-10 MED ORDER — ENSURE ENLIVE PO LIQD: 237.0000 mL | Freq: Two times a day (BID) | ORAL | 12 refills | Status: AC

## 2019-05-10 NOTE — Discharge Instructions (Signed)

## 2019-05-10 NOTE — NC FL2 (Signed)
[facility] MEDICAID FL2 LEVEL OF CARE SCREENING TOOL     IDENTIFICATION  Patient Name: Veronica Flowers Birthdate: [birth year] Sex: female Admission Date (Current Location): 05/08/2019  [facility] and [place] Number:  Herbalist and Address:  [facility] [place], [place] [address] [place], [place] [address]      Provider Number: [id number]  Attending Physician Name and Address:  Lavina Hamman, MD  Relative Name and Phone Number:  Hoyle Sauer [phone]    Current Level of Care: Other (Comment)(ALF) Recommended Level of Care: Assisted Living Facility Prior Approval Number:    Date Approved/Denied:   PASRR Number:    Discharge Plan: (ALF)    Current Diagnoses: Patient Active Problem List   Diagnosis Date Noted  . Left knee pain 05/08/2019  . Confusion 05/08/2019  . Closed displaced supracondylar fracture of distal end of left femur without intracondylar extension ([place]) 09/11/2018  . Unspecified atrial fibrillation ([place]) 09/11/2018  . Chronic anticoagulation 09/11/2018  . Essential hypertension 09/11/2018  . Dementia, presenile with depression ([place]) 09/11/2018  . Displaced supracondylar fracture without intracondylar extension of lower end of left femur, initial encounter for closed fracture ([place]) 09/08/2018    Orientation RESPIRATION BLADDER Height & Weight     Self  Normal Incontinent, External catheter Weight: 129 lb 13.6 oz (58.9 kg) Height:  5\' 7"  (170.2 cm)  BEHAVIORAL SYMPTOMS/MOOD NEUROLOGICAL BOWEL NUTRITION STATUS      Incontinent Diet(cardiac)  AMBULATORY STATUS COMMUNICATION OF NEEDS Skin   Extensive Assist Verbally Skin abrasions(left knee, ecchymosis)                       Personal Care Assistance Level of Assistance  Bathing, Feeding, Dressing Bathing Assistance: Limited assistance Feeding assistance: Independent Dressing Assistance: Limited assistance     Functional Limitations Info  Sight, Hearing, Speech Sight Info:  Adequate Hearing Info: Adequate Speech Info: Adequate    SPECIAL CARE FACTORS FREQUENCY                       Contractures Contractures Info: Not present    Additional Factors Info  Code Status, Allergies Code Status Info: Full Allergies Info: NKA           Current Medications (05/10/2019):  This is the current hospital active medication list Current Facility-Administered Medications  Medication Dose Route Frequency Provider Last Rate Last Admin  . acetaminophen (TYLENOL) tablet 650 mg  650 mg Oral Q6H PRN Etta Quill, DO   650 mg at 05/09/19 1242   Or  . acetaminophen (TYLENOL) suppository 650 mg  650 mg Rectal Q6H PRN Etta Quill, DO      . apixaban Arne Cleveland) tablet 2.5 mg  2.5 mg Oral BID Hosie Poisson, MD   2.5 mg at 05/10/19 1014  . ARIPiprazole (ABILIFY) tablet 2 mg  2 mg Oral Daily Jennette Kettle M, DO   2 mg at 05/10/19 1013  . ascorbic acid (VITAMIN C) tablet 500 mg  500 mg Oral Daily Jennette Kettle M, DO   500 mg at 05/10/19 1013  . cholecalciferol (VITAMIN D3) tablet 1,000 Units  1,000 Units Oral Daily Jennette Kettle M, DO   1,000 Units at 05/10/19 1013  . digoxin (LANOXIN) tablet 0.125 mg  0.125 mg Oral Daily Jennette Kettle M, DO   0.125 mg at 05/10/19 1013  . feeding supplement (ENSURE ENLIVE) (ENSURE ENLIVE) liquid 237 mL  237 mL Oral BID BM Alcario Drought,  Heywood Iles, DO   237 mL at 05/10/19 1555  . fluticasone (FLONASE) 50 MCG/ACT nasal spray 1 spray  1 spray Each Nare Daily Lyda Perone M, DO      . furosemide (LASIX) tablet 20 mg  20 mg Oral Daily Lyda Perone M, DO   20 mg at 05/10/19 1013  . loratadine (CLARITIN) tablet 10 mg  10 mg Oral QHS Lyda Perone M, DO   10 mg at 05/09/19 2153  . LORazepam (ATIVAN) tablet 0.5 mg  0.5 mg Oral Daily Lyda Perone M, DO   0.5 mg at 05/10/19 1013  . Melatonin TABS 3 mg  3 mg Oral QHS Lyda Perone M, DO   3 mg at 05/09/19 2158  . metoprolol succinate (TOPROL-XL) 24 hr tablet 100 mg  100 mg Oral Daily  Lyda Perone M, DO   100 mg at 05/10/19 1013  . Muscle Rub CREA 1 application  1 application Topical TID Hillary Bow, DO   1 application at 05/10/19 1557  . nystatin (MYCOSTATIN/NYSTOP) topical powder 1 application  1 g Topical BID Hillary Bow, DO   1 application at 05/10/19 1017  . ondansetron (ZOFRAN) tablet 4 mg  4 mg Oral Q6H PRN Hillary Bow, DO       Or  . ondansetron [facility]) injection 4 mg  4 mg Intravenous Q6H PRN Hillary Bow, DO      . polyethylene glycol (MIRALAX / GLYCOLAX) packet 17 g  17 g Oral Daily Lyda Perone M, DO   17 g at 05/10/19 1015  . potassium chloride (KLOR-CON) CR tablet 10 mEq  10 mEq Oral Daily Lyda Perone M, DO   10 mEq at 05/10/19 1013  . senna (SENOKOT) tablet 8.6 mg  1 tablet Oral QHS Lyda Perone M, DO   8.6 mg at 05/09/19 2152  . sertraline (ZOLOFT) tablet 300 mg  300 mg Oral Rhae Hammock M, DO   300 mg at 05/09/19 5956     Discharge Medications: Please see discharge summary for a list of discharge medications.  Relevant Imaging Results:  Relevant Lab Results:   Additional Information    Patrice Paradise, LCSW

## 2019-05-10 NOTE — Progress Notes (Signed)
CSW was alerted that patient is medically stable to discharge to [facility].  CSW has attempted to call facility 4 times and no one is picking up on the unit. CSW left voice message on the voicemail and awaiting a call back. CSW alerted MD and RN of attempts to reach facility.

## 2019-05-10 NOTE — Progress Notes (Addendum)
CSW spoke with facility and they are requesting a Fl2 and discharge summary to be faxed to facility for review before accepting her back. CSW will completed fl2 and request the discharge summary from MD.  4:45- CSW attempted to follow up with facility to see if patient could return today however did not reach anyone.

## 2019-05-10 NOTE — Care Management Obs Status (Signed)
[place] NOTIFICATION   Patient Details  Name: Veronica Flowers MRN: [id number] Date of Birth: 08-Mar-1928   Medicare Observation Status Notification Given:  Yes  Mailed to daughter in law, [place], at [address], [place], [place] [address].   Claudie Leach, RN 05/10/2019, 8:54 AM

## 2019-05-10 NOTE — TOC Transition Note (Signed)
Transition of Care [facility]) - CM/SW Discharge Note   Patient Details  Name: Veronica Flowers MRN: [id number] Date of Birth: [birth year]  Transition of Care [facility]) CM/SW Contact:  Claudie Leach, RN 05/10/2019, 9:27 AM   Clinical Narrative:    Patient to return to [facility].  Daughter in law states she would like for her to travel via ambulance.  They are not interested in pursuing physical therapy as patient has not participated in therapy during the last few months.     Final next level of care: Other (comment)([facility]) Barriers to Discharge: No Barriers Identified    Discharge Plan and Services    HH Arranged: Refused [facility]

## 2019-05-10 NOTE — Discharge Summary (Signed)
[facility] Discharge Summary   Patient: Veronica Flowers LOV:[id number]   PCP: System, Pcp Not In DOB: 23-May-1927   Date of admission: 05/08/2019   Date of discharge:  05/10/2019    Discharge Diagnoses:  Principal Problem:   Confusion Active Problems:   Unspecified atrial fibrillation (HCC)   Essential hypertension   Left knee pain  Admitted From: ALF Disposition:  ALF   Recommendations for Outpatient Follow-up:  1. PCP: please follow up in 1 week 2. Follow up LABS/TEST:  none  Follow-up Information    PCP. Schedule an appointment as soon as possible for a visit in 1 week(s).          Diet recommendation: Cardiac diet  Activity: The patient is advised to gradually reintroduce usual activities,as tolerated  Discharge Condition: good  Code Status: Full code   History of present illness: As per the H and P dictated on admission, "Veronica Flowers is a 85 y.o. female with medical history significant of A.Fib believed to be on eliquis, HTN.  Doesn't sound like she has significant dementia at baseline from discussion with daughter-in-law (though this is listed in problem list).  Patient presents to the ED after a fall getting out of bed to try to go to bathroom.  Turns out patient has been non-ambulatory and uses a wheelchair since 2 falls earlier this year with hip and distal femur fractures."  Hospital Course:  Summary of her active problems in the hospital is as following. Multiple falls with knee pain on ambulation. Probably secondary to deconditioning and worsening of her dementia. X-rays do not show any fractures at this time. Patient is afebrile and mild leukocytosis, no signs of infection at this time. Urine analysis is negative urine cultures are growing multiple species none predominant, more likely contaminant. Chest x-ray does not show any pneumonia or fluid at this time COVID-19 is negative. Per PT eval Pt's family does not wish to pursue any further rehab  for the pt at this time and would like for her to return to her ALF. No further acute PT needs identified at this time  Paroxysmal atrial fibrillation Rate controlled and on Eliquis for anticoagulation.  Essential hypertension:  Well controlled.   Left knee Arthralgia Tylenol for Pain control. CT knee No acute fracture or dislocation. Advanced osteopenia and osteoarthritic changes of the knee.   Patient was seen by physical therapy, who recommended ALF, which was arranged. On the day of the discharge the patient's vitals were stable, and no other acute medical condition were reported by patient. the patient was felt safe to be discharge at ALF/ILF with Therapy but pt/family refused.  Consultants: none Procedures: none  DISCHARGE MEDICATION: Allergies as of 05/10/2019   No Known Allergies     Medication List    TAKE these medications   acetaminophen 500 MG tablet Commonly known as: TYLENOL Take 500 mg by mouth every 6 (six) hours as needed for moderate pain or headache.   ARIPiprazole 2 MG tablet Commonly known as: ABILIFY Take 2 mg by mouth daily.   ascorbic acid 500 MG tablet Commonly known as: VITAMIN C Take 500 mg by mouth daily.   Biofreeze 4 % Gel Generic drug: Menthol (Topical Analgesic) Apply 1 application topically 3 (three) times daily. Apply to left hip,knee, and thigh   CENTRUM PO Take 1 tablet by mouth daily.   cholecalciferol 25 MCG (1000 UT) tablet Commonly known as: VITAMIN D3 Take 1,000 Units by mouth daily.   digoxin 0.125 MG  tablet Commonly known as: LANOXIN Take 0.125 mg by mouth daily.   Eliquis 5 MG Tabs tablet Generic drug: apixaban Take 5 mg by mouth 2 (two) times daily.   feeding supplement (ENSURE ENLIVE) Liqd Take 237 mLs by mouth 2 (two) times daily between meals. Start taking on: May 11, 2019   fluticasone 50 MCG/ACT nasal spray Commonly known as: FLONASE Place 1 spray into both nostrils 2 (two) times a day.     furosemide 20 MG tablet Commonly known as: LASIX Take 20 mg by mouth daily.   loratadine 10 MG tablet Commonly known as: CLARITIN Take 10 mg by mouth at bedtime.   LORazepam 0.5 MG tablet Commonly known as: ATIVAN Take 0.5 mg by mouth daily.   Melatonin 3 MG Caps Take 3 mg by mouth at bedtime.   metoprolol succinate 100 MG 24 hr tablet Commonly known as: TOPROL-XL Take 100 mg by mouth daily.   nystatin powder Generic drug: nystatin Apply 1 g topically 2 (two) times daily. Under breasts   polyethylene glycol 17 g packet Commonly known as: MIRALAX / GLYCOLAX Take 17 g by mouth daily.   potassium chloride 10 MEQ CR capsule Commonly known as: MICRO-K Take 10 mEq by mouth daily.   senna 8.6 MG Tabs tablet Commonly known as: SENOKOT Take 1 tablet by mouth at bedtime.   sertraline 100 MG tablet Commonly known as: ZOLOFT Take 300 mg by mouth every other day.      No Known Allergies Discharge Instructions    Diet - low sodium heart healthy   Complete by: As directed    Increase activity slowly   Complete by: As directed      Discharge Exam: Filed Weights   05/08/19 1747 05/08/19 2243  Weight: 59 kg 58.9 kg   Vitals:   05/10/19 0347 05/10/19 0756  BP: 106/65 (!) 123/46  Pulse: 72 66  Resp:  15  Temp: 98.2 F (36.8 C) 98.5 F (36.9 C)  SpO2: 96% 98%   General: Appear in no distress, no Rash; Oral Mucosa Clear, moist. no Abnormal Mass Or lumps Cardiovascular: S1 and S2 Present, no Murmur, Respiratory: normal respiratory effort, Bilateral Air entry present and Clear to Auscultation, no Crackles, no wheezes Abdomen: Bowel Sound present, Soft and no tenderness, no hernia Extremities: no Pedal edema, no calf tenderness Neurology: alert and oriented to time, place, and person affect appropriate.  The results of significant diagnostics from this hospitalization (including imaging, microbiology, ancillary and laboratory) are listed below for reference.     Significant Diagnostic Studies: DG Chest 1 View  Result Date: 05/08/2019 CLINICAL DATA:  Fall attempting to get out of bed. EXAM: CHEST  1 VIEW COMPARISON:  None. FINDINGS: The heart is enlarged. Atherosclerotic changes are noted at the aortic arch. Is no edema or effusion. No focal airspace disease is present. Degenerative changes are present in the shoulders, left greater than right. IMPRESSION: 1. Cardiomegaly without failure. 2. No acute cardiopulmonary disease. 3. Aortic atherosclerosis. Electronically Signed   By: Marin Roberts M.D.   On: 05/08/2019 19:01   DG Pelvis 1-2 Views  Result Date: 05/08/2019 CLINICAL DATA:  Unwitnessed fall from bed. EXAM: PELVIS - 1-2 VIEW COMPARISON:  Pelvic and left hip radiographs 02/01/2019. FINDINGS: Status post intramedullary nail and dynamic screw fixation of an intertrochanteric left intertrochanteric femur fracture. The hardware is intact, but incompletely visualized on these views. The mildly displaced intertrochanteric fracture appears unchanged. No evidence of acute pelvic fracture or dislocation. Mild spondylosis  in the lower lumbar spine. IMPRESSION: No acute pelvic findings. Stable appearance of the left femoral intertrochanteric fracture post ORIF. Electronically Signed   By: Carey Bullocks M.D.   On: 05/08/2019 19:05   CT Head Wo Contrast  Result Date: 05/08/2019 CLINICAL DATA:  Head trauma. Unwitnessed fall bed. Difficulty moving right lower extremity. Left periorbital soft tissue swelling and hematoma. The patient is on Eliquis. EXAM: CT HEAD WITHOUT CONTRAST CT MAXILLOFACIAL WITHOUT CONTRAST CT CERVICAL SPINE WITHOUT CONTRAST TECHNIQUE: Multidetector CT imaging of the head, cervical spine, and maxillofacial structures were performed using the standard protocol without intravenous contrast. Multiplanar CT image reconstructions of the cervical spine and maxillofacial structures were also generated. COMPARISON:  None. FINDINGS: CT HEAD  FINDINGS Brain: Moderate atrophy and white matter changes are present bilaterally. No acute infarct, hemorrhage, or mass lesion is present. Basal ganglia intact. The ventricles are proportionate to the degree of atrophy. No significant extraaxial fluid collection is present. The brainstem and cerebellum are within normal limits. Vascular: Minimal atherosclerotic changes are present at cavernous internal carotid arteries. There is no hyperdense vessel. Skull: Left periorbital soft tissue hematoma is present. There is no underlying fracture. Calvarium is intact. No other focal soft tissue trauma is present. CT MAXILLOFACIAL FINDINGS Osseous: No acute fractures are present. The mandible is intact and located. Orbits: Left periorbital hematoma is present without underlying fracture. Bilateral lens replacements are noted. Globes and orbits are otherwise unremarkable. Sinuses: The paranasal sinuses and mastoid air cells are clear. Soft tissues: No other focal soft tissue injury is present. Facial muscles are within normal limits otherwise. CT CERVICAL SPINE FINDINGS Alignment: No significant listhesis is present. There is straightening of the normal cervical lordosis. Skull base and vertebrae: Craniocervical junction is within normal limits. Mild degenerative changes are noted. Vertebral body heights are maintained. No acute or healing fractures are present. Soft tissues and spinal canal: No prevertebral fluid or swelling. No visible canal hematoma. Disc levels: Multilevel facet degenerative changes are present in the upper cervical spine. Right greater than left uncovertebral spurring is greatest at C5-6 and C6-7. Upper chest: The lung apices are clear. Thoracic inlet is normal. IMPRESSION: 1. Left periorbital soft tissue hematoma without underlying fracture. 2. No other focal soft tissue trauma. 3. Normal CT appearance of the brain for age. 4. No acute osseous injury to the face. 5. Multilevel degenerative changes of  the cervical spine without acute fracture or traumatic subluxation. Electronically Signed   By: Marin Roberts M.D.   On: 05/08/2019 18:41   CT Cervical Spine Wo Contrast  Result Date: 05/08/2019 CLINICAL DATA:  Head trauma. Unwitnessed fall bed. Difficulty moving right lower extremity. Left periorbital soft tissue swelling and hematoma. The patient is on Eliquis. EXAM: CT HEAD WITHOUT CONTRAST CT MAXILLOFACIAL WITHOUT CONTRAST CT CERVICAL SPINE WITHOUT CONTRAST TECHNIQUE: Multidetector CT imaging of the head, cervical spine, and maxillofacial structures were performed using the standard protocol without intravenous contrast. Multiplanar CT image reconstructions of the cervical spine and maxillofacial structures were also generated. COMPARISON:  None. FINDINGS: CT HEAD FINDINGS Brain: Moderate atrophy and white matter changes are present bilaterally. No acute infarct, hemorrhage, or mass lesion is present. Basal ganglia intact. The ventricles are proportionate to the degree of atrophy. No significant extraaxial fluid collection is present. The brainstem and cerebellum are within normal limits. Vascular: Minimal atherosclerotic changes are present at cavernous internal carotid arteries. There is no hyperdense vessel. Skull: Left periorbital soft tissue hematoma is present. There is no underlying fracture. Calvarium  is intact. No other focal soft tissue trauma is present. CT MAXILLOFACIAL FINDINGS Osseous: No acute fractures are present. The mandible is intact and located. Orbits: Left periorbital hematoma is present without underlying fracture. Bilateral lens replacements are noted. Globes and orbits are otherwise unremarkable. Sinuses: The paranasal sinuses and mastoid air cells are clear. Soft tissues: No other focal soft tissue injury is present. Facial muscles are within normal limits otherwise. CT CERVICAL SPINE FINDINGS Alignment: No significant listhesis is present. There is straightening of the  normal cervical lordosis. Skull base and vertebrae: Craniocervical junction is within normal limits. Mild degenerative changes are noted. Vertebral body heights are maintained. No acute or healing fractures are present. Soft tissues and spinal canal: No prevertebral fluid or swelling. No visible canal hematoma. Disc levels: Multilevel facet degenerative changes are present in the upper cervical spine. Right greater than left uncovertebral spurring is greatest at C5-6 and C6-7. Upper chest: The lung apices are clear. Thoracic inlet is normal. IMPRESSION: 1. Left periorbital soft tissue hematoma without underlying fracture. 2. No other focal soft tissue trauma. 3. Normal CT appearance of the brain for age. 4. No acute osseous injury to the face. 5. Multilevel degenerative changes of the cervical spine without acute fracture or traumatic subluxation. Electronically Signed   By: Marin Robertshristopher  Mattern M.D.   On: 05/08/2019 18:41   CT Knee Left Wo Contrast  Result Date: 05/08/2019 CLINICAL DATA:  85 year old female with left knee fracture. EXAM: CT OF THE left KNEE WITHOUT CONTRAST TECHNIQUE: Multidetector CT imaging of the left knee was performed according to the standard protocol. Multiplanar CT image reconstructions were also generated. COMPARISON:  Left knee radiograph dated 05/08/2019 and 02/01/2019. FINDINGS: Evaluation is limited due to streak artifact caused by metallic fixation hardware. Bones/Joint/Cartilage There is no acute fracture or dislocation. Focal area of indentation of the anterior aspect of the distal femur similar to prior radiograph. There is advanced osteopenia and osteoarthritic changes of the knee. There is a small suprapatellar effusion. Ligaments Suboptimally assessed by CT. Muscles and Tendons No acute findings. Soft tissues There is diffuse skin thickening and subcutaneous soft tissue edema of the lateral aspect of the knee. No fluid collection. Clinical correlation is recommended to  evaluate for cellulitis or an infectious process. IMPRESSION: 1. No acute fracture or dislocation. 2. Advanced osteopenia and osteoarthritic changes of the knee. 3. Small suprapatellar effusion. 4. Skin thickening and subcutaneous soft tissue edema of the lateral aspect of the knee. Clinical correlation is recommended to evaluate for cellulitis or an infectious process. Electronically Signed   By: Elgie CollardArash  Radparvar M.D.   On: 05/08/2019 20:37   DG FEMUR MIN 2 VIEWS LEFT  Result Date: 05/08/2019 CLINICAL DATA:  Unwitnessed fall from bed, bruising and abrasion LEFT knee EXAM: LEFT FEMUR 2 VIEWS COMPARISON:  LEFT knee 02/01/2019 FINDINGS: Osseous demineralization. IM nail across a prior intertrochanteric fracture LEFT femur. Visualized pelvis intact. Lateral plate, multiple screws, and a cerclage wire identified across a previously identified distal LEFT femoral metaphyseal fracture. Hardware intact. Diffuse joint space narrowing LEFT knee. No new fracture, dislocation, or bone destruction. IMPRESSION: Post ORIF of previous proximal and distal LEFT femoral fractures. No new osseous abnormalities identified. Osseous demineralization with degenerative changes LEFT knee. Electronically Signed   By: Ulyses SouthwardMark  Boles M.D.   On: 05/08/2019 19:01   CT Maxillofacial WO CM  Result Date: 05/08/2019 CLINICAL DATA:  Head trauma. Unwitnessed fall bed. Difficulty moving right lower extremity. Left periorbital soft tissue swelling and hematoma. The patient  is on Eliquis. EXAM: CT HEAD WITHOUT CONTRAST CT MAXILLOFACIAL WITHOUT CONTRAST CT CERVICAL SPINE WITHOUT CONTRAST TECHNIQUE: Multidetector CT imaging of the head, cervical spine, and maxillofacial structures were performed using the standard protocol without intravenous contrast. Multiplanar CT image reconstructions of the cervical spine and maxillofacial structures were also generated. COMPARISON:  None. FINDINGS: CT HEAD FINDINGS Brain: Moderate atrophy and white matter  changes are present bilaterally. No acute infarct, hemorrhage, or mass lesion is present. Basal ganglia intact. The ventricles are proportionate to the degree of atrophy. No significant extraaxial fluid collection is present. The brainstem and cerebellum are within normal limits. Vascular: Minimal atherosclerotic changes are present at cavernous internal carotid arteries. There is no hyperdense vessel. Skull: Left periorbital soft tissue hematoma is present. There is no underlying fracture. Calvarium is intact. No other focal soft tissue trauma is present. CT MAXILLOFACIAL FINDINGS Osseous: No acute fractures are present. The mandible is intact and located. Orbits: Left periorbital hematoma is present without underlying fracture. Bilateral lens replacements are noted. Globes and orbits are otherwise unremarkable. Sinuses: The paranasal sinuses and mastoid air cells are clear. Soft tissues: No other focal soft tissue injury is present. Facial muscles are within normal limits otherwise. CT CERVICAL SPINE FINDINGS Alignment: No significant listhesis is present. There is straightening of the normal cervical lordosis. Skull base and vertebrae: Craniocervical junction is within normal limits. Mild degenerative changes are noted. Vertebral body heights are maintained. No acute or healing fractures are present. Soft tissues and spinal canal: No prevertebral fluid or swelling. No visible canal hematoma. Disc levels: Multilevel facet degenerative changes are present in the upper cervical spine. Right greater than left uncovertebral spurring is greatest at C5-6 and C6-7. Upper chest: The lung apices are clear. Thoracic inlet is normal. IMPRESSION: 1. Left periorbital soft tissue hematoma without underlying fracture. 2. No other focal soft tissue trauma. 3. Normal CT appearance of the brain for age. 4. No acute osseous injury to the face. 5. Multilevel degenerative changes of the cervical spine without acute fracture or  traumatic subluxation. Electronically Signed   By: Marin Roberts M.D.   On: 05/08/2019 18:41    Microbiology: Recent Results (from the past 240 hour(s))  SARS CORONAVIRUS 2 (TAT 6-24 HRS) Nasopharyngeal Nasopharyngeal Swab     Status: None   Collection Time: 05/08/19  7:38 PM   Specimen: Nasopharyngeal Swab  Result Value Ref Range Status   SARS Coronavirus 2 NEGATIVE NEGATIVE Final    Comment: (NOTE) SARS-CoV-2 target nucleic acids are NOT DETECTED. The SARS-CoV-2 RNA is generally detectable in upper and lower respiratory specimens during the acute phase of infection. Negative results do not preclude SARS-CoV-2 infection, do not rule out co-infections with other pathogens, and should not be used as the sole basis for treatment or other patient management decisions. Negative results must be combined with clinical observations, patient history, and epidemiological information. The expected result is Negative. Fact Sheet for Patients: HairSlick.no Fact Sheet for Healthcare Providers: quierodirigir.com This test is not yet approved or cleared by the [place] FDA and  has been authorized for detection and/or diagnosis of SARS-CoV-2 by FDA under an Emergency Use Authorization (EUA). This EUA will remain  in effect (meaning this test can be used) for the duration of the COVID-19 declaration under Section 56 4(b)(1) of the Act, 21 U.S.C. section 360bbb-3(b)(1), unless the authorization is terminated or revoked sooner. Performed at [facility], [address], [place], [place] [address]   Urine culture  Status: Abnormal   Collection Time: 05/08/19  9:31 PM   Specimen: Urine, Random  Result Value Ref Range Status   Specimen Description URINE, RANDOM  Final   Special Requests   Final    NONE Performed at [facility], [address], [place], [place] [address]    Culture MULTIPLE SPECIES PRESENT, SUGGEST  RECOLLECTION (A)  Final   Report Status 05/10/2019 FINAL  Final  MRSA PCR Screening     Status: None   Collection Time: 05/08/19 11:17 PM   Specimen: Nasopharyngeal  Result Value Ref Range Status   MRSA by PCR NEGATIVE NEGATIVE Final    Comment:        The GeneXpert MRSA Assay (FDA approved for NASAL specimens only), is one component of a comprehensive MRSA colonization surveillance program. It is not intended to diagnose MRSA infection nor to guide or monitor treatment for MRSA infections. Performed at [facility], [place] [address], , [place] [address]     Labs: CBC: Recent Labs  Lab 05/08/19 1935  WBC 11.8*  NEUTROABS 9.4*  HGB 13.3  HCT 40.4  MCV 98.1  PLT 711   Basic Metabolic Panel: Recent Labs  Lab 05/08/19 1935  NA 136  K 4.3  CL 103  CO2 26  GLUCOSE 119*  BUN 13  CREATININE 1.10*  CALCIUM 9.1   Liver Function Tests: No results for input(s): AST, ALT, ALKPHOS, BILITOT, PROT, ALBUMIN in the last 168 hours. No results for input(s): LIPASE, AMYLASE in the last 168 hours. No results for input(s): AMMONIA in the last 168 hours. Cardiac Enzymes: No results for input(s): CKTOTAL, CKMB, CKMBINDEX, TROPONINI in the last 168 hours. BNP (last 3 results) No results for input(s): BNP in the last 8760 hours. CBG: No results for input(s): GLUCAP in the last 168 hours.  Time spent: 35 minutes  Signed:  Berle Mull  [facility]  05/10/2019 4:00 PM

## 2019-05-10 NOTE — Plan of Care (Signed)
  Problem: Clinical Measurements: Goal: Respiratory complications will improve Outcome: Progressing Note: On room air   Problem: Nutrition: Goal: Adequate nutrition will be maintained Outcome: Progressing   Problem: Coping: Goal: Level of anxiety will decrease Outcome: Progressing   Problem: Elimination: Goal: Will not experience complications related to urinary retention Outcome: Progressing   Problem: Pain Managment: Goal: General experience of comfort will improve Outcome: Progressing Note: No complaints of pain today   Problem: Safety: Goal: Ability to remain free from injury will improve Outcome: Progressing

## 2019-05-11 NOTE — Progress Notes (Signed)
Tried to call report to [facility] for pt to return, unable to get in touch with anyone, called twice and left a message, awaiting call back. [facility] has already picked pt up and is in transit back to [place]

## 2019-05-11 NOTE — TOC Transition Note (Signed)
Transition of Care [facility] - CM/SW Discharge Note   Patient Details  Name: Veronica Flowers MRN: [id number] Date of Birth: 12-24-1927  [facility]) CM/SW Contact:  Atilano Median, LCSW Phone Number: 05/11/2019, 12:17 PM   Clinical Narrative:    Discharged back to ALF. Return coordinated with Abigail Butts [phone]. Patient's daughter Hoyle Sauer aware and agreeable to this plan. Number to call report [phone]. Patient aware and agreeable to this plan. No other needs at this time. Case closed to this CSW.   Final next level of care: Other (comment)([facility]) Barriers to Discharge: No Barriers Identified   Patient Goals and CMS Choice        Discharge Placement                Patient to be transferred to facility by: [place] Name of family member notified: Hoyle Sauer Patient and family notified of of transfer: 05/11/19  Discharge Plan and Services                          HH Arranged: Refused HH          Social Determinants of Health (SDOH) Interventions     Readmission Risk Interventions No flowsheet data found.

## 2019-05-11 NOTE — Plan of Care (Signed)

## 2019-05-11 NOTE — Progress Notes (Signed)
Report called to Sena Hitch at [facility]. She informed me that pt was already there.

## 2019-08-13 ENCOUNTER — Emergency Department (HOSPITAL_COMMUNITY)
Admission: EM | Admit: 2019-08-13 | Discharge: 2019-08-13 | Disposition: A | Discharge status: Skilled Nursing Facility | Payer: Medicare PPO | Attending: Emergency Medicine | Admitting: Emergency Medicine

## 2019-08-13 ENCOUNTER — Other Ambulatory Visit: Payer: Self-pay

## 2019-08-13 ENCOUNTER — Emergency Department (HOSPITAL_COMMUNITY): Payer: Medicare PPO

## 2019-08-13 ENCOUNTER — Encounter (HOSPITAL_COMMUNITY): Payer: Self-pay

## 2019-08-13 DIAGNOSIS — M25562 Pain in left knee: Secondary | ICD-10-CM | POA: Diagnosis not present

## 2019-08-13 DIAGNOSIS — S0012XA Contusion of left eyelid and periocular area, initial encounter: Secondary | ICD-10-CM | POA: Insufficient documentation

## 2019-08-13 DIAGNOSIS — S0990XA Unspecified injury of head, initial encounter: Secondary | ICD-10-CM | POA: Diagnosis present

## 2019-08-13 DIAGNOSIS — S0083XA Contusion of other part of head, initial encounter: Secondary | ICD-10-CM

## 2019-08-13 DIAGNOSIS — S8992XA Unspecified injury of left lower leg, initial encounter: Secondary | ICD-10-CM | POA: Insufficient documentation

## 2019-08-13 DIAGNOSIS — W06XXXA Fall from bed, initial encounter: Secondary | ICD-10-CM | POA: Insufficient documentation

## 2019-08-13 DIAGNOSIS — S0181XA Laceration without foreign body of other part of head, initial encounter: Secondary | ICD-10-CM | POA: Insufficient documentation

## 2019-08-13 DIAGNOSIS — S79912A Unspecified injury of left hip, initial encounter: Secondary | ICD-10-CM | POA: Insufficient documentation

## 2019-08-13 DIAGNOSIS — Y929 Unspecified place or not applicable: Secondary | ICD-10-CM | POA: Insufficient documentation

## 2019-08-13 DIAGNOSIS — M25552 Pain in left hip: Secondary | ICD-10-CM | POA: Diagnosis not present

## 2019-08-13 DIAGNOSIS — Y9389 Activity, other specified: Secondary | ICD-10-CM | POA: Diagnosis not present

## 2019-08-13 DIAGNOSIS — Y999 Unspecified external cause status: Secondary | ICD-10-CM | POA: Insufficient documentation

## 2019-08-13 MED ORDER — ACETAMINOPHEN 500 MG PO TABS
1000.0000 mg | ORAL_TABLET | Freq: Once | ORAL | Status: AC
  Administered 2019-08-13: 1000 mg via ORAL
  Filled 2019-08-13: qty 2

## 2019-08-13 MED ORDER — OXYCODONE HCL 5 MG PO TABS
2.5000 mg | ORAL_TABLET | Freq: Once | ORAL | Status: AC
  Administered 2019-08-13: 2.5 mg via ORAL
  Filled 2019-08-13: qty 1

## 2019-08-13 NOTE — ED Notes (Signed)
Pt transported to Xray at this time.

## 2019-08-13 NOTE — ED Notes (Signed)
.  called ptar  Called facility  Spoke with family ( daughter law in room )  Paper work complete  Waiting transport

## 2019-08-13 NOTE — ED Notes (Signed)
Update provided to pts daughter in law

## 2019-08-13 NOTE — ED Provider Notes (Addendum)
[facility]-EMERGENCY DEPT Provider Note   CSN: [id number] Arrival date & time: 08/13/19  1717     History Chief Complaint  Patient presents with  . Fall    Veronica Flowers is a 86 y.o. female.  86 yo F with a chief complaint of a fall.  Patient was trying to get out of bed and she lost her balance and struck her head on the bedside table.  This happened about an hour ago.  She is complaining only of pain to her left knee.  Was noticed to have a significant periorbital hematoma and wound just above the left eyebrow.  She said that it bled a lot but that it does not hurt.  She denies neck pain.  Denies other extremity pain other than the knee.  Denies chest pain denies abdominal pain denies back pain.  The history is provided by the patient.  Fall This is a new problem. The current episode started 2 days ago. The problem occurs constantly. The problem has not changed since onset.Pertinent negatives include no chest pain, no headaches and no shortness of breath. Nothing aggravates the symptoms. Nothing relieves the symptoms. She has tried nothing for the symptoms. The treatment provided no relief.       Past Medical History:  Diagnosis Date  . A-fib (HCC)   . Falls   . Femur fracture, left (HCC)    supracondylar  . Hypertension   . Major depressive disorder   . Nausea   . Seasonal allergies     Patient Active Problem List   Diagnosis Date Noted  . Left knee pain 05/08/2019  . Confusion 05/08/2019  . Closed displaced supracondylar fracture of distal end of left femur without intracondylar extension (HCC) 09/11/2018  . Unspecified atrial fibrillation (HCC) 09/11/2018  . Chronic anticoagulation 09/11/2018  . Essential hypertension 09/11/2018  . Dementia, presenile with depression (HCC) 09/11/2018  . Displaced supracondylar fracture without intracondylar extension of lower end of left femur, initial encounter for closed fracture (HCC) 09/08/2018    Past  Surgical History:  Procedure Laterality Date  . FRACTURE SURGERY     left femur  . ORIF FEMUR FRACTURE Left 09/11/2018   Procedure: OPEN REDUCTION INTERNAL FIXATION (ORIF) DISTAL FEMUR FRACTURE;  Surgeon: Sheral ApleyMurphy, Timothy D, MD;  Location: [facility] OR;  Service: Orthopedics;  Laterality: Left;     OB History   No obstetric history on file.     Family History  Family history unknown: Yes    Social History   Tobacco Use  . Smoking status: Never Smoker  . Smokeless tobacco: Never Used  Substance Use Topics  . Alcohol use: Not Currently  . Drug use: Never    Home Medications Prior to Admission medications   Medication Sig Start Date End Date Taking? Authorizing Provider  acetaminophen (TYLENOL) 500 MG tablet Take 500 mg by mouth every 6 (six) hours as needed for moderate pain or headache.    [provider]  apixaban (ELIQUIS) 5 MG TABS tablet Take 5 mg by mouth 2 (two) times daily.    [provider]  ARIPiprazole (ABILIFY) 2 MG tablet Take 2 mg by mouth daily.    [provider]  ascorbic acid (VITAMIN C) 500 MG tablet Take 500 mg by mouth daily.    [provider]  cholecalciferol (VITAMIN D3) 25 MCG (1000 UT) tablet Take 1,000 Units by mouth daily.    [provider]  digoxin (LANOXIN) 0.125 MG tablet Take 0.125 mg  by mouth daily.    [provider]  feeding supplement, ENSURE ENLIVE, (ENSURE ENLIVE) LIQD Take 237 mLs by mouth 2 (two) times daily between meals. 05/11/19   Rolly Salter, MD  fluticasone (FLONASE) 50 MCG/ACT nasal spray Place 1 spray into both nostrils 2 (two) times a day.    [provider]  furosemide (LASIX) 20 MG tablet Take 20 mg by mouth daily. 04/21/19   [provider]  loratadine (CLARITIN) 10 MG tablet Take 10 mg by mouth at bedtime.    [provider]  LORazepam (ATIVAN) 0.5 MG tablet Take 0.5 mg by mouth daily.    [provider]  Melatonin 3 MG CAPS Take 3 mg by  mouth at bedtime.    [provider]  Menthol, Topical Analgesic, (BIOFREEZE) 4 % GEL Apply 1 application topically 3 (three) times daily. Apply to left hip,knee, and thigh    [provider]  metoprolol succinate (TOPROL-XL) 100 MG 24 hr tablet Take 100 mg by mouth daily.     [provider]  Multiple Vitamins-Minerals (CENTRUM PO) Take 1 tablet by mouth daily.    [provider]  nystatin (NYSTATIN) powder Apply 1 g topically 2 (two) times daily. Under breasts    [provider]  polyethylene glycol (MIRALAX / GLYCOLAX) 17 g packet Take 17 g by mouth daily.    [provider]  potassium chloride (MICRO-K) 10 MEQ CR capsule Take 10 mEq by mouth daily. 04/21/19   [provider]  senna (SENOKOT) 8.6 MG TABS tablet Take 1 tablet by mouth at bedtime.    [provider]  sertraline (ZOLOFT) 100 MG tablet Take 300 mg by mouth every other day. 04/15/19   [provider]    Allergies    Patient has no known allergies.  Review of Systems   Review of Systems  Constitutional: Negative for chills and fever.  HENT: Negative for congestion and rhinorrhea.   Eyes: Negative for redness and visual disturbance.  Respiratory: Negative for shortness of breath and wheezing.   Cardiovascular: Negative for chest pain and palpitations.  Gastrointestinal: Negative for nausea and vomiting.  Genitourinary: Negative for dysuria and urgency.  Musculoskeletal: Positive for arthralgias and myalgias.  Skin: Negative for pallor and wound.  Neurological: Negative for dizziness and headaches.    Physical Exam Updated Vital Signs BP 120/67 (BP Location: Left Arm)   Pulse 70   Temp 98.3 F (36.8 C) (Oral)   Resp 18   SpO2 96%   Physical Exam Vitals and nursing note reviewed.  Constitutional:      General: She is not in acute distress.    Appearance: She is well-developed. She is not diaphoretic.  HENT:     Head: Normocephalic.      Comments: Significant left periorbital hematoma.  I am able to pry open the eyelids and the pupil is round and reactive.  Appears to go in all directions.  Significant tenderness about the periorbital hematoma. Eyes:     Pupils: Pupils are equal, round, and reactive to light.  Cardiovascular:     Rate and Rhythm: Normal rate and regular rhythm.     Heart sounds: No murmur. No friction rub. No gallop.   Pulmonary:     Effort: Pulmonary effort is normal.     Breath sounds: No wheezing or rales.  Abdominal:     General: There is no distension.     Palpations: Abdomen is soft.  Tenderness: There is no abdominal tenderness.  Musculoskeletal:        General: Tenderness present.     Cervical back: Normal range of motion and neck supple.     Comments: Left lower extremity is significantly shorter than the right.  Mild pain with internal and external rotation of the left lower extremity.  Pulse motor and sensation are intact distally.  Palpated from head to toe without any other noted areas of bony tenderness.  Skin:    General: Skin is warm and dry.  Neurological:     Mental Status: She is alert and oriented to person, place, and time.  Psychiatric:        Behavior: Behavior normal.     ED Results / Procedures / Treatments   Labs (all labs ordered are listed, but only abnormal results are displayed) Labs Reviewed - No data to display  EKG None  Radiology CT Head Wo Contrast  Result Date: 08/13/2019 CLINICAL DATA:  Acute pain due to trauma. Pain the left eye. EXAM: CT HEAD WITHOUT CONTRAST CT MAXILLOFACIAL WITHOUT CONTRAST CT CERVICAL SPINE WITHOUT CONTRAST TECHNIQUE: Multidetector CT imaging of the head, cervical spine, and maxillofacial structures were performed using the standard protocol without intravenous contrast. Multiplanar CT image reconstructions of the cervical spine and maxillofacial structures were also generated. COMPARISON:  CT dated May 08, 2019. FINDINGS: CT HEAD  FINDINGS Brain: There is a small hyperdensity along the anterior falx on the right which is new since the patient's prior CT head and is favored to represent a small volume of extra-axial acute hemorrhage. There is no midline shift. Atrophy and chronic microvascular ischemic changes are noted. Vascular: No hyperdense vessel or unexpected calcification. Skull: Left frontal scalp and left periorbital soft tissue swelling is noted without evidence for an underlying calvarial fracture. Other: None. CT MAXILLOFACIAL FINDINGS Osseous: No fracture or mandibular dislocation. No destructive process. Orbits: Negative. No traumatic or inflammatory finding. Sinuses: There is some mucosal thickening of the right maxillary sinus. Otherwise, the remaining paranasal sinuses and mastoid air cells are essentially clear. Soft tissues: There is extensive soft tissue swelling in the left periorbital region. CT CERVICAL SPINE FINDINGS Alignment: There is some straightening of the normal cervical lordotic curvature. Skull base and vertebrae: No acute fracture. No primary bone lesion or focal pathologic process. Soft tissues and spinal canal: No prevertebral fluid or swelling. No visible canal hematoma. Disc levels: Disc height loss is noted at the C4-C5, C5-C6, and C6-C7 levels. Upper chest: Negative. Other: Bilateral cervical ribs are noted. IMPRESSION: 1. Small hyperdensity along the anterior falx on the right is favored to represent a small volume of extra-axial acute hemorrhage, less than 1 cc in volume. This finding is new since May 08, 2019 CT head. 2. Left frontal scalp and left periorbital soft tissue swelling without evidence for an underlying calvarial fracture. 3. No facial fracture identified. 4. Degenerative changes of the cervical spine. No acute displaced fracture. 5. Bilateral cervical ribs. These results were called by telephone at the time of interpretation on 08/13/2019 at 6:35 pm to provider Acel Natzke , who verbally  acknowledged these results. Electronically Signed   By: Katherine Mantle M.D.   On: 08/13/2019 18:38   CT Cervical Spine Wo Contrast  Result Date: 08/13/2019 CLINICAL DATA:  Acute pain due to trauma. Pain the left eye. EXAM: CT HEAD WITHOUT CONTRAST CT MAXILLOFACIAL WITHOUT CONTRAST CT CERVICAL SPINE WITHOUT CONTRAST TECHNIQUE: Multidetector CT imaging of the head, cervical spine, and maxillofacial structures  were performed using the standard protocol without intravenous contrast. Multiplanar CT image reconstructions of the cervical spine and maxillofacial structures were also generated. COMPARISON:  CT dated May 08, 2019. FINDINGS: CT HEAD FINDINGS Brain: There is a small hyperdensity along the anterior falx on the right which is new since the patient's prior CT head and is favored to represent a small volume of extra-axial acute hemorrhage. There is no midline shift. Atrophy and chronic microvascular ischemic changes are noted. Vascular: No hyperdense vessel or unexpected calcification. Skull: Left frontal scalp and left periorbital soft tissue swelling is noted without evidence for an underlying calvarial fracture. Other: None. CT MAXILLOFACIAL FINDINGS Osseous: No fracture or mandibular dislocation. No destructive process. Orbits: Negative. No traumatic or inflammatory finding. Sinuses: There is some mucosal thickening of the right maxillary sinus. Otherwise, the remaining paranasal sinuses and mastoid air cells are essentially clear. Soft tissues: There is extensive soft tissue swelling in the left periorbital region. CT CERVICAL SPINE FINDINGS Alignment: There is some straightening of the normal cervical lordotic curvature. Skull base and vertebrae: No acute fracture. No primary bone lesion or focal pathologic process. Soft tissues and spinal canal: No prevertebral fluid or swelling. No visible canal hematoma. Disc levels: Disc height loss is noted at the C4-C5, C5-C6, and C6-C7 levels. Upper chest:  Negative. Other: Bilateral cervical ribs are noted. IMPRESSION: 1. Small hyperdensity along the anterior falx on the right is favored to represent a small volume of extra-axial acute hemorrhage, less than 1 cc in volume. This finding is new since May 08, 2019 CT head. 2. Left frontal scalp and left periorbital soft tissue swelling without evidence for an underlying calvarial fracture. 3. No facial fracture identified. 4. Degenerative changes of the cervical spine. No acute displaced fracture. 5. Bilateral cervical ribs. These results were called by telephone at the time of interpretation on 08/13/2019 at 6:35 pm to provider Ramar Nobrega , who verbally acknowledged these results. Electronically Signed   By: Katherine Mantle M.D.   On: 08/13/2019 18:38   DG Knee Complete 4 Views Left  Result Date: 08/13/2019 CLINICAL DATA:  Left knee pain after a fall EXAM: LEFT KNEE - COMPLETE 4+ VIEW COMPARISON:  Left knee radiographs dated 02/01/2019 FINDINGS: The patient is status post fixation of a distal femoral fracture with plate and screws as well as cerclage wire. An intramedullary nail is also partially imaged. There is unchanged 3 mm separation of the fixation plate from the cortex of the femoral metaphysis. The hardware appears intact. No evidence of fracture, dislocation, or joint effusion. There is diffuse osseous demineralization and moderate tricompartmental osteoarthritis. IMPRESSION: 1. No acute osseous injury. Electronically Signed   By: Romona Curls M.D.   On: 08/13/2019 18:11   DG Hip Unilat W or Wo Pelvis 2-3 Views Left  Result Date: 08/13/2019 CLINICAL DATA:  Left hip pain after a fall. EXAM: DG HIP (WITH OR WITHOUT PELVIS) 2-3V LEFT COMPARISON:  Left femur radiographs dated 05/08/2019 FINDINGS: The patient is status post fixation for a chronic intertrochanteric left femur fracture. The hardware appears intact and there is cortical bridging across the fracture fragments. There is no evidence of acute  hip fracture or dislocation. Degenerative changes are seen in the spine. There is diffuse osseous demineralization. IMPRESSION: 1. No acute fracture or dislocation. 2. Status post fixation for chronic intertrochanteric left femur fracture. Electronically Signed   By: Romona Curls M.D.   On: 08/13/2019 18:07   CT Maxillofacial Wo Contrast  Result Date: 08/13/2019  CLINICAL DATA:  Acute pain due to trauma. Pain the left eye. EXAM: CT HEAD WITHOUT CONTRAST CT MAXILLOFACIAL WITHOUT CONTRAST CT CERVICAL SPINE WITHOUT CONTRAST TECHNIQUE: Multidetector CT imaging of the head, cervical spine, and maxillofacial structures were performed using the standard protocol without intravenous contrast. Multiplanar CT image reconstructions of the cervical spine and maxillofacial structures were also generated. COMPARISON:  CT dated May 08, 2019. FINDINGS: CT HEAD FINDINGS Brain: There is a small hyperdensity along the anterior falx on the right which is new since the patient's prior CT head and is favored to represent a small volume of extra-axial acute hemorrhage. There is no midline shift. Atrophy and chronic microvascular ischemic changes are noted. Vascular: No hyperdense vessel or unexpected calcification. Skull: Left frontal scalp and left periorbital soft tissue swelling is noted without evidence for an underlying calvarial fracture. Other: None. CT MAXILLOFACIAL FINDINGS Osseous: No fracture or mandibular dislocation. No destructive process. Orbits: Negative. No traumatic or inflammatory finding. Sinuses: There is some mucosal thickening of the right maxillary sinus. Otherwise, the remaining paranasal sinuses and mastoid air cells are essentially clear. Soft tissues: There is extensive soft tissue swelling in the left periorbital region. CT CERVICAL SPINE FINDINGS Alignment: There is some straightening of the normal cervical lordotic curvature. Skull base and vertebrae: No acute fracture. No primary bone lesion or  focal pathologic process. Soft tissues and spinal canal: No prevertebral fluid or swelling. No visible canal hematoma. Disc levels: Disc height loss is noted at the C4-C5, C5-C6, and C6-C7 levels. Upper chest: Negative. Other: Bilateral cervical ribs are noted. IMPRESSION: 1. Small hyperdensity along the anterior falx on the right is favored to represent a small volume of extra-axial acute hemorrhage, less than 1 cc in volume. This finding is new since May 08, 2019 CT head. 2. Left frontal scalp and left periorbital soft tissue swelling without evidence for an underlying calvarial fracture. 3. No facial fracture identified. 4. Degenerative changes of the cervical spine. No acute displaced fracture. 5. Bilateral cervical ribs. These results were called by telephone at the time of interpretation on 08/13/2019 at 6:35 pm to provider Ravynn Hogate , who verbally acknowledged these results. Electronically Signed   By: Constance Holster M.D.   On: 08/13/2019 18:38    Procedures .[name] KitchenLaceration Repair  Date/Time: 08/13/2019 8:02 PM Performed by: Deno Etienne, DO Authorized by: Deno Etienne, DO   Consent:    Consent obtained:  Verbal   Consent given by:  Patient   Risks discussed:  Infection, pain, poor wound healing and poor cosmetic result   Alternatives discussed:  No treatment, delayed treatment and observation Anesthesia (see MAR for exact dosages):    Anesthesia method:  None Laceration details:    Location:  Face   Face location:  Forehead   Length (cm):  2.8 Repair type:    Repair type:  Simple Exploration:    Hemostasis achieved with:  Direct pressure   Wound exploration: entire depth of wound probed and visualized     Contaminated: no   Treatment:    Area cleansed with:  Saline   Amount of cleaning:  Standard   Irrigation solution:  Sterile saline   Irrigation volume:  20   Irrigation method:  Syringe   Visualized foreign bodies/material removed: no   Skin repair:    Repair method:   Tissue adhesive Approximation:    Approximation:  Close Post-procedure details:    Dressing:  Open (no dressing)   Patient tolerance of procedure:  Tolerated well,  no immediate complications   (including critical care time)  Medications Ordered in ED Medications  acetaminophen (TYLENOL) tablet 1,000 mg (1,000 mg Oral Given 08/13/19 1810)  oxyCODONE (Oxy IR/ROXICODONE) immediate release tablet 2.5 mg (2.5 mg Oral Given 08/13/19 1810)    ED Course  I have reviewed the triage vital signs and the nursing notes.  Pertinent labs & imaging results that were available during my care of the patient were reviewed by me and considered in my medical decision making (see chart for details).    MDM Rules/Calculators/A&P                      86 yo F with a nonsyncopal fall.  Patient rolled out of the bed and struck her face on her bedside table.  No significant injury to the eye itself.  Will obtain CT imaging of the head face and C-spine.  Should her left lower extremity seems to be shorter than the right, patient does not think that this is chronic.  Will obtain a plain film of the left hip and knee.  Plain films are negative as viewed by me.  CT scan of the head face neck without any bony injury.  There was a very small intracranial hemorrhage noted.  I discussed this with Dr. Danielle Dess, neurosurgery no intervention and no need for hospitalization per him we will have her follow-up with her family doctor.  CRITICAL CARE Performed by: Rae Roam   Total critical care time: 35 minutes  Critical care time was exclusive of separately billable procedures and treating other patients.  Critical care was necessary to treat or prevent imminent or life-threatening deterioration.  Critical care was time spent personally by me on the following activities: development of treatment plan with patient and/or surrogate as well as nursing, discussions with consultants, evaluation of patient's response to  treatment, examination of patient, obtaining history from patient or surrogate, ordering and performing treatments and interventions, ordering and review of laboratory studies, ordering and review of radiographic studies, pulse oximetry and re-evaluation of patient's condition.   4:31 PM:  I have discussed the diagnosis/risks/treatment options with the patient and believe the pt to be eligible for discharge home to follow-up with PCP. We also discussed returning to the ED immediately if new or worsening sx occur. We discussed the sx which are most concerning (e.g., sudden worsening pain, fever, inability to tolerate by mouth) that necessitate immediate return. Medications administered to the patient during their visit and any new prescriptions provided to the patient are listed below.  Medications given during this visit Medications  acetaminophen (TYLENOL) tablet 1,000 mg (1,000 mg Oral Given 08/13/19 1810)  oxyCODONE (Oxy IR/ROXICODONE) immediate release tablet 2.5 mg (2.5 mg Oral Given 08/13/19 1810)     The patient appears reasonably screen and/or stabilized for discharge and I doubt any other medical condition or other [facility] requiring further screening, evaluation, or treatment in the ED at this time prior to discharge.   Final Clinical Impression(s) / ED Diagnoses Final diagnoses:  Contusion of face, initial encounter    Rx / DC Orders ED Discharge Orders    None       Melene Plan, DO 08/13/19 2003    Melene Plan, DO 08/14/19 1631

## 2019-08-13 NOTE — ED Triage Notes (Signed)
Pt arrives [facility] from [place]. Per EMS: Pt had an unwitnessed fall hitting left eye and head on bedside table. Unknown LOC. Pt is on Eliquis. Pt has dementia at baseline. Pt has chronic knee pain.

## 2019-09-18 ENCOUNTER — Emergency Department (HOSPITAL_COMMUNITY)
Admission: EM | Admit: 2019-09-18 | Discharge: 2019-09-19 | Disposition: A | Discharge status: Home / Self Care | Payer: Medicare PPO | Attending: Emergency Medicine | Admitting: Emergency Medicine

## 2019-09-18 ENCOUNTER — Other Ambulatory Visit: Payer: Self-pay

## 2019-09-18 DIAGNOSIS — I10 Essential (primary) hypertension: Secondary | ICD-10-CM | POA: Diagnosis not present

## 2019-09-18 DIAGNOSIS — R410 Disorientation, unspecified: Secondary | ICD-10-CM | POA: Diagnosis not present

## 2019-09-18 DIAGNOSIS — Z7901 Long term (current) use of anticoagulants: Secondary | ICD-10-CM | POA: Insufficient documentation

## 2019-09-18 DIAGNOSIS — Z79899 Other long term (current) drug therapy: Secondary | ICD-10-CM | POA: Diagnosis not present

## 2019-09-18 DIAGNOSIS — N939 Abnormal uterine and vaginal bleeding, unspecified: Secondary | ICD-10-CM | POA: Diagnosis present

## 2019-09-18 DIAGNOSIS — N3091 Cystitis, unspecified with hematuria: Secondary | ICD-10-CM | POA: Insufficient documentation

## 2019-09-18 LAB — BASIC METABOLIC PANEL
Anion gap: 9 (ref 5–15)
BUN: 20 mg/dL (ref 8–23)
CO2: 28 mmol/L (ref 22–32)
Calcium: 8.9 mg/dL (ref 8.9–10.3)
Chloride: 100 mmol/L (ref 98–111)
Creatinine, Ser: 1.23 mg/dL — ABNORMAL HIGH (ref 0.44–1.00)
GFR calc Af Amer: 45 mL/min — ABNORMAL LOW (ref >60)
GFR calc non Af Amer: 39 mL/min — ABNORMAL LOW (ref >60)
Glucose, Bld: 104 mg/dL — ABNORMAL HIGH (ref 70–99)
Potassium: 4.2 mmol/L (ref 3.5–5.1)
Sodium: 137 mmol/L (ref 135–145)

## 2019-09-18 LAB — URINALYSIS, ROUTINE W REFLEX MICROSCOPIC
RBC / HPF: >50 RBC/hpf — ABNORMAL HIGH (ref 0–5)
WBC, UA: >50 WBC/hpf — ABNORMAL HIGH (ref 0–5)

## 2019-09-18 LAB — CBC WITH DIFFERENTIAL/PLATELET
Abs Immature Granulocytes: 0.03 10*3/uL (ref 0.00–0.07)
Basophils Absolute: 0.1 10*3/uL (ref 0.0–0.1)
Basophils Relative: 1 %
Eosinophils Absolute: 0.4 10*3/uL (ref 0.0–0.5)
Eosinophils Relative: 4 %
HCT: 39.3 % (ref 36.0–46.0)
Hemoglobin: 12.7 g/dL (ref 12.0–15.0)
Immature Granulocytes: 0 %
Lymphocytes Relative: 20 %
Lymphs Abs: 1.8 10*3/uL (ref 0.7–4.0)
MCH: 31.7 pg (ref 26.0–34.0)
MCHC: 32.3 g/dL (ref 30.0–36.0)
MCV: 98 fL (ref 80.0–100.0)
Monocytes Absolute: 0.6 10*3/uL (ref 0.1–1.0)
Monocytes Relative: 7 %
Neutro Abs: 6.4 10*3/uL (ref 1.7–7.7)
Neutrophils Relative %: 68 %
Platelets: 247 10*3/uL (ref 150–400)
RBC: 4.01 MIL/uL (ref 3.87–5.11)
RDW: 12.8 % (ref 11.5–15.5)
WBC: 9.2 10*3/uL (ref 4.0–10.5)
nRBC: 0 % (ref 0.0–0.2)

## 2019-09-18 MED ORDER — CEPHALEXIN 500 MG PO CAPS
500.0000 mg | ORAL_CAPSULE | Freq: Once | ORAL | Status: AC
  Administered 2019-09-18: 500 mg via ORAL
  Filled 2019-09-18: qty 1

## 2019-09-18 MED ORDER — CEPHALEXIN 250 MG PO CAPS: 250.0000 mg | ORAL_CAPSULE | Freq: Four times a day (QID) | ORAL | 0 refills | Status: DC

## 2019-09-18 NOTE — ED Triage Notes (Signed)
Pt BIBA from [place] on [address]-   Per EMS- Staff reports noticing vaginal bleeding this AM.  Pt denies pain, denies any complaints. Pt non ambulatory at baseline. AOx4.

## 2019-09-18 NOTE — ED Notes (Signed)
In/out cath performed, large blood clots noted in urine and in catheter tubing.

## 2019-09-18 NOTE — ED Notes (Signed)
Called [facility]  Comp[leted paper work  [employer]

## 2019-09-18 NOTE — Discharge Instructions (Signed)
The bleeding, seen today, is from the urine, and she has a urinary tract infection. Encourage her to drink plenty of fluids, and eat regularly. Start the antibiotic prescription tomorrow morning. Follow-up with your PCP for checkup in 1 week. Usually that is a good time to repeat the urine test. Tell your doctor that we did a urine culture that he can follow-up on.

## 2019-09-18 NOTE — ED Provider Notes (Signed)
[facility]-EMERGENCY DEPT Provider Note   CSN: [id number] Arrival date & time: 09/18/19  1447     History Chief Complaint  Patient presents with  . Vaginal Bleeding    Veronica Flowers is a 86 y.o. female.  HPI Patient, who is a poor historian, presents from her nursing care facility where it was noticed that she had blood, during perineal cleansing care.  It was suspected that the blood came from the vagina.  This is a new problem, never before current.  Patient is on Eliquis for atrial fibrillation.  History by her daughter who is at the bedside.  Level 5 caveat-confusion    Past Medical History:  Diagnosis Date  . A-fib (HCC)   . Falls   . Femur fracture, left (HCC)    supracondylar  . Hypertension   . Major depressive disorder   . Nausea   . Seasonal allergies     Patient Active Problem List   Diagnosis Date Noted  . Left knee pain 05/08/2019  . Confusion 05/08/2019  . Closed displaced supracondylar fracture of distal end of left femur without intracondylar extension (HCC) 09/11/2018  . Unspecified atrial fibrillation (HCC) 09/11/2018  . Chronic anticoagulation 09/11/2018  . Essential hypertension 09/11/2018  . Dementia, presenile with depression (HCC) 09/11/2018  . Displaced supracondylar fracture without intracondylar extension of lower end of left femur, initial encounter for closed fracture (HCC) 09/08/2018    Past Surgical History:  Procedure Laterality Date  . FRACTURE SURGERY     left femur  . ORIF FEMUR FRACTURE Left 09/11/2018   Procedure: OPEN REDUCTION INTERNAL FIXATION (ORIF) DISTAL FEMUR FRACTURE;  Surgeon: Sheral Apley, MD;  Location: [facility] OR;  Service: Orthopedics;  Laterality: Left;     OB History   No obstetric history on file.     Family History  Family history unknown: Yes    Social History   Tobacco Use  . Smoking status: Never Smoker  . Smokeless tobacco: Never Used  Substance Use Topics  . Alcohol use:  Not Currently  . Drug use: Never    Home Medications Prior to Admission medications   Medication Sig Start Date End Date Taking? Authorizing Provider  acetaminophen (TYLENOL) 500 MG tablet Take 500 mg by mouth every 6 (six) hours as needed for moderate pain or headache.    [provider]  apixaban (ELIQUIS) 5 MG TABS tablet Take 5 mg by mouth 2 (two) times daily.    [provider]  ARIPiprazole (ABILIFY) 2 MG tablet Take 2 mg by mouth daily.    [provider]  ascorbic acid (VITAMIN C) 500 MG tablet Take 500 mg by mouth daily.    [provider]  cephALEXin (KEFLEX) 250 MG capsule Take 1 capsule (250 mg total) by mouth 4 (four) times daily. 09/18/19   Mancel Bale, MD  cholecalciferol (VITAMIN D3) 25 MCG (1000 UT) tablet Take 1,000 Units by mouth daily.    [provider]  digoxin (LANOXIN) 0.125 MG tablet Take 0.125 mg by mouth daily.    [provider]  feeding supplement, ENSURE ENLIVE, (ENSURE ENLIVE) LIQD Take 237 mLs by mouth 2 (two) times daily between meals. 05/11/19   Rolly Salter, MD  fluticasone (FLONASE) 50 MCG/ACT nasal spray Place 1 spray into both nostrils 2 (two) times a day.    [provider]  furosemide (LASIX) 20 MG tablet Take 20 mg by mouth daily. 04/21/19   [provider]  loratadine (CLARITIN)  10 MG tablet Take 10 mg by mouth at bedtime.    [provider]  LORazepam (ATIVAN) 0.5 MG tablet Take 0.5 mg by mouth daily.    [provider]  Melatonin 3 MG CAPS Take 3 mg by mouth at bedtime.    [provider]  Menthol, Topical Analgesic, (BIOFREEZE) 4 % GEL Apply 1 application topically 3 (three) times daily. Apply to left hip,knee, and thigh    [provider]  metoprolol succinate (TOPROL-XL) 100 MG 24 hr tablet Take 100 mg by mouth daily.     [provider]  Multiple Vitamins-Minerals (CENTRUM PO) Take 1 tablet by mouth daily.    [provider]  nystatin (NYSTATIN) powder Apply 1 g topically 2 (two) times daily. Under breasts    [provider]  polyethylene glycol (MIRALAX / GLYCOLAX) 17 g packet Take 17 g by mouth daily.    [provider]  potassium chloride (MICRO-K) 10 MEQ CR capsule Take 10 mEq by mouth daily. 04/21/19   [provider]  senna (SENOKOT) 8.6 MG TABS tablet Take 1 tablet by mouth at bedtime.    [provider]  sertraline (ZOLOFT) 100 MG tablet Take 300 mg by mouth every other day. 04/15/19   [provider]    Allergies    Patient has no known allergies.  Review of Systems   Review of Systems  Unable to perform ROS: Mental status change    Physical Exam Updated Vital Signs BP (!) 108/42   Pulse (!) 53   Temp 98.1 F (36.7 C) (Oral)   Resp 18   SpO2 99%   Physical Exam Vitals and nursing note reviewed.  Constitutional:      General: She is not in acute distress.    Appearance: She is well-developed. She is not ill-appearing, toxic-appearing or diaphoretic.  HENT:     Head: Normocephalic and atraumatic.     Right Ear: External ear normal.     Left Ear: External ear normal.  Eyes:     Conjunctiva/sclera: Conjunctivae normal.     Pupils: Pupils are equal, round, and reactive to light.  Neck:     Trachea: Phonation normal.  Cardiovascular:     Rate and Rhythm: Normal rate.  Pulmonary:     Effort: Pulmonary effort is normal.  Abdominal:     General: There is no distension.     Palpations: Abdomen is soft.     Tenderness: There is no abdominal tenderness. There is no guarding.  Genitourinary:    Comments: Perineal and vaginal exam, done with patient on bed, knees flexed, with assistance of 2 nurses.  She has atrophy of the external vaginal structures and vaginal mucosa appears pale.  There was a small amount of blood in the external vagina, with visible bloody urine extruding from the urethra during the exam.  This essentially locates  the source of bleeding to the urinary tract. Musculoskeletal:        General: Normal range of motion.     Cervical back: Normal range of motion and neck supple.  Skin:    General: Skin is warm and dry.  Neurological:     Mental Status: She is alert and oriented to person, place, and time.     Cranial Nerves: No cranial nerve deficit.     Sensory: No sensory deficit.     Motor: No abnormal muscle tone.     Coordination: Coordination normal.  Psychiatric:  Behavior: Behavior normal.        Thought Content: Thought content normal.        Judgment: Judgment normal.     ED Results / Procedures / Treatments   Labs (all labs ordered are listed, but only abnormal results are displayed) Labs Reviewed  BASIC METABOLIC PANEL - Abnormal; Notable for the following components:      Result Value   Glucose, Bld 104 (*)    Creatinine, Ser 1.23 (*)    GFR calc non Af Amer 39 (*)    GFR calc Af Amer 45 (*)    All other components within normal limits  URINALYSIS, ROUTINE W REFLEX MICROSCOPIC - Abnormal; Notable for the following components:   Color, Urine DARK (*)    APPearance TURBID (*)    Glucose, UA   (*)    Value: TEST NOT REPORTED DUE TO COLOR INTERFERENCE OF URINE PIGMENT   Hgb urine dipstick   (*)    Value: TEST NOT REPORTED DUE TO COLOR INTERFERENCE OF URINE PIGMENT   Bilirubin Urine   (*)    Value: TEST NOT REPORTED DUE TO COLOR INTERFERENCE OF URINE PIGMENT   Ketones, ur   (*)    Value: TEST NOT REPORTED DUE TO COLOR INTERFERENCE OF URINE PIGMENT   Protein, ur   (*)    Value: TEST NOT REPORTED DUE TO COLOR INTERFERENCE OF URINE PIGMENT   Nitrite   (*)    Value: TEST NOT REPORTED DUE TO COLOR INTERFERENCE OF URINE PIGMENT   Leukocytes,Ua   (*)    Value: TEST NOT REPORTED DUE TO COLOR INTERFERENCE OF URINE PIGMENT   RBC / HPF >50 (*)    WBC, UA >50 (*)    Bacteria, UA MANY (*)    All other components within normal limits  URINE CULTURE  CBC WITH DIFFERENTIAL/PLATELET      EKG None  Radiology No results found.  Procedures Procedures (including critical care time)  Medications Ordered in ED Medications  cephALEXin (KEFLEX) capsule 500 mg (500 mg Oral Given 09/18/19 1924)    ED Course  I have reviewed the triage vital signs and the nursing notes.  Pertinent labs & imaging results that were available during my care of the patient were reviewed by me and considered in my medical decision making (see chart for details).  Clinical Course as of Sep 18 1927  Fri Sep 18, 2019  1854 Abnormal, testing incomplete due to turbidity, many bacteria and greater than 50 RBC and WBC.  Urine culture ordered.  Urinalysis, Routine w reflex microscopic(!) [EW]  1854 Normal  CBC with Differential [EW]  1854 Normal except glucose high, creatinine high, GFR low  Basic metabolic panel(!) [EW]    Clinical Course User Index [EW] Daleen Bo, MD   MDM Rules/Calculators/A&P                       Patient Vitals for the past 24 hrs:  BP Temp Temp src Pulse Resp SpO2  09/18/19 1830 (!) 108/42 -- -- (!) 53 18 99 %  09/18/19 1700 (!) 106/40 -- -- (!) 55 17 97 %  09/18/19 1507 129/64 98.1 F (36.7 C) Oral 60 18 93 %  09/18/19 1459 -- -- -- -- -- 98 %    7:29 PM Reevaluation with update and discussion. After initial assessment and treatment, an updated evaluation reveals she is comfortable and drinking fluids at this time. I discussed with patient and daughter,  all questions answered. Mancel Bale   Medical Decision Making:  This patient is presenting for evaluation of bleeding from the perineum, which does require a range of treatment options, and is a complaint that involves a high risk of morbidity and mortality. The differential diagnoses include vaginal bleeding, rectal bleeding, urinary tract bleeding, skin bleeding. I decided  to review old records, and in summary elderly patient who is currently on anticoagulation, with a bleeding disorder.  I obtained  additional historical information from her daughter, at the bedside.  Clinical Laboratory Tests Ordered, included CBC, metabolic panel, urinalysis, urine culture. Review indicates hemorrhagic cystitis, with normal hemoglobin and white count..   Critical Interventions-clinical evaluation, laboratory testing, treatment and observation  After These Interventions, the Patient was reevaluated and was found stable for discharge with hemorrhagic cystitis and normal hemoglobin  CRITICAL CARE-no Performed by: Mancel Bale  Nursing Notes Reviewed/ Care Coordinated Applicable Imaging Reviewed Interpretation of Laboratory Data incorporated into ED treatment  The patient appears reasonably screened and/or stabilized for discharge and I doubt any other medical condition or other [facility] requiring further screening, evaluation, or treatment in the ED at this time prior to discharge.  Plan: Home Medications-continue current; Home Treatments-drink plenty of fluids; return here if the recommended treatment, does not improve the symptoms; Recommended follow up-PCP follow-up 1 week and as needed    Final Clinical Impression(s) / ED Diagnoses Final diagnoses:  Hemorrhagic cystitis    Rx / DC Orders ED Discharge Orders         Ordered    cephALEXin (KEFLEX) 250 MG capsule  4 times daily     09/18/19 1929           Mancel Bale, MD 09/18/19 1941

## 2019-09-20 LAB — URINE CULTURE: Culture: >=100000 — AB

## 2019-09-21 ENCOUNTER — Telehealth: Payer: Self-pay | Admitting: *Deleted

## 2019-09-21 NOTE — Telephone Encounter (Signed)
Post ED Visit - Positive Culture Follow-up  Culture report reviewed by antimicrobial stewardship pharmacist: [facility] []  [address] Pharm.D. []  [place], Pharm.D., BCPS AQ-ID []  , Pharm.D., BCPS []  Celedonio Miyamoto, .D., BCPS []  [place], .D., BCPS, AAHIVP []  Georgina Pillion, Pharm.D., BCPS, AAHIVP []  [address], PharmD, BCPS []  , PharmD, BCPS []  [place], PharmD, BCPS []  [address], PharmD []  , PharmD, BCPS []  Estella Husk, PharmD  Pharmacy Team []  Lysle Pearl, PharmD [x]  , PharmD []  Phillips Climes, PharmD []  , Rph []  Agapito Games) , PharmD []  Verlan Friends, PharmD []  , PharmD []  Mervyn Gay, PharmD []  , PharmD []  Vinnie Level, PharmD []  [facility], PharmD []  , PharmD []  Len Childs, PharmD   Positive urine culture Treated with Cephalexin, organism sensitive to the same and no further patient follow-up is required at this time.  [facility] 09/21/2019, 1:51 PM

## 2019-09-21 NOTE — Telephone Encounter (Deleted)
Post ED Visit - Positive Culture Follow-up: Unsuccessful Patient Follow-up  Culture assessed and recommendations reviewed by:  []  , Pharm.D. []  Enzo Bi, .D., BCPS AQ-ID []  Celedonio Miyamoto, Pharm.D., BCPS []  [address], .D., BCPS []  [place], .D., BCPS, AAHIVP []  Georgina Pillion, Pharm.D., BCPS, AAHIVP []  [address], PharmD []  , PharmD, BCPS  Positive urine culture  []  Patient discharged without antimicrobial prescription and treatment is now indicated []  Organism is resistant to prescribed ED discharge antimicrobial []  Patient with positive blood cultures   Unable to contact patient after 3 attempts, letter will be sent to address on file  [place] 09/21/2019, 1:49 PM

## 2019-09-22 ENCOUNTER — Other Ambulatory Visit: Payer: Self-pay

## 2019-09-22 ENCOUNTER — Emergency Department (HOSPITAL_COMMUNITY)
Admission: EM | Admit: 2019-09-22 | Discharge: 2019-09-22 | Disposition: A | Discharge status: Skilled Nursing Facility | Payer: Medicare PPO | Attending: Emergency Medicine | Admitting: Emergency Medicine

## 2019-09-22 ENCOUNTER — Emergency Department (HOSPITAL_COMMUNITY): Payer: Medicare PPO

## 2019-09-22 DIAGNOSIS — F0390 Unspecified dementia without behavioral disturbance: Secondary | ICD-10-CM | POA: Diagnosis not present

## 2019-09-22 DIAGNOSIS — I4891 Unspecified atrial fibrillation: Secondary | ICD-10-CM | POA: Diagnosis not present

## 2019-09-22 DIAGNOSIS — I10 Essential (primary) hypertension: Secondary | ICD-10-CM | POA: Insufficient documentation

## 2019-09-22 DIAGNOSIS — N939 Abnormal uterine and vaginal bleeding, unspecified: Secondary | ICD-10-CM | POA: Diagnosis present

## 2019-09-22 DIAGNOSIS — R319 Hematuria, unspecified: Secondary | ICD-10-CM | POA: Insufficient documentation

## 2019-09-22 LAB — CBC
HCT: 36.9 % (ref 36.0–46.0)
Hemoglobin: 12.1 g/dL (ref 12.0–15.0)
MCH: 31.8 pg (ref 26.0–34.0)
MCHC: 32.8 g/dL (ref 30.0–36.0)
MCV: 96.9 fL (ref 80.0–100.0)
Platelets: 252 10*3/uL (ref 150–400)
RBC: 3.81 MIL/uL — ABNORMAL LOW (ref 3.87–5.11)
RDW: 12.7 % (ref 11.5–15.5)
WBC: 8.1 10*3/uL (ref 4.0–10.5)
nRBC: 0 % (ref 0.0–0.2)

## 2019-09-22 LAB — BASIC METABOLIC PANEL
Anion gap: 10 (ref 5–15)
BUN: 17 mg/dL (ref 8–23)
CO2: 30 mmol/L (ref 22–32)
Calcium: 9 mg/dL (ref 8.9–10.3)
Chloride: 100 mmol/L (ref 98–111)
Creatinine, Ser: 1.25 mg/dL — ABNORMAL HIGH (ref 0.44–1.00)
GFR calc Af Amer: 44 mL/min — ABNORMAL LOW (ref >60)
GFR calc non Af Amer: 38 mL/min — ABNORMAL LOW (ref >60)
Glucose, Bld: 104 mg/dL — ABNORMAL HIGH (ref 70–99)
Potassium: 4 mmol/L (ref 3.5–5.1)
Sodium: 140 mmol/L (ref 135–145)

## 2019-09-22 MED ORDER — IOHEXOL 300 MG/ML  SOLN
100.0000 mL | Freq: Once | INTRAMUSCULAR | Status: AC | PRN
  Administered 2019-09-22: 19:00:00 80 mL via INTRAVENOUS

## 2019-09-22 NOTE — ED Notes (Signed)
PT transferred to CT.

## 2019-09-22 NOTE — ED Notes (Signed)
PTAR here for pt transport back to facility, DNR paperwork given to [facility]

## 2019-09-22 NOTE — ED Triage Notes (Signed)
Pt BIBA from [facility] on [facility] [address] [address] [facility] [address]-  Per EMS- Staff reports noticing vaginal bleeding this AM.  Pt recently seen at ED for same. Pt non ambulatory at baseline. Aox2. Staff reports pt at baseline.

## 2019-09-22 NOTE — ED Notes (Signed)
PTAR called for pt transfer back to facility  

## 2019-09-22 NOTE — ED Provider Notes (Signed)
[facility] Emergency Department Provider Note MRN:  [id number]  Arrival date & time: 09/22/19     Chief Complaint   Bleeding History of Present Illness   Veronica Flowers is a 86 y.o. year-old female with a history of A. fib presenting to the ED with chief complaint of bleeding.  Patient coming from care facility with continued blood in diaper.  Recent ED evaluation with diagnosis of hemorrhagic cystitis.  Patient is unaware where she is, baseline dementia.  She denies pain, denies fever.  I was unable to obtain an accurate HPI, PMH, or ROS due to the patient's dementia.  Level 5 caveat.  Review of Systems  Positive for bleeding.  Patient's Health History    Past Medical History:  Diagnosis Date  . A-fib ([place])   . Falls   . Femur fracture, left (HCC)    supracondylar  . Hypertension   . Major depressive disorder   . Nausea   . Seasonal allergies     Past Surgical History:  Procedure Laterality Date  . FRACTURE SURGERY     left femur  . ORIF FEMUR FRACTURE Left 09/11/2018   Procedure: OPEN REDUCTION INTERNAL FIXATION (ORIF) DISTAL FEMUR FRACTURE;  Surgeon: Renette Butters, MD;  Location: [facility];  Service: Orthopedics;  Laterality: Left;    Family History  Family history unknown: Yes    Social History   Socioeconomic History  . Marital status: Widowed    Spouse name: Not on file  . Number of children: Not on file  . Years of education: Not on file  . Highest education level: Not on file  Occupational History  . Not on file  Tobacco Use  . Smoking status: Never Smoker  . Smokeless tobacco: Never Used  Substance and Sexual Activity  . Alcohol use: Not Currently  . Drug use: Never  . Sexual activity: Not on file  Other Topics Concern  . Not on file  Social History Narrative   Currently recuperating at skilled facility   Social Determinants of Health   Financial Resource Strain:   . Difficulty of Paying Living Expenses:   Food  Insecurity:   . Worried About Charity fundraiser in the Last Year:   . Arboriculturist in the Last Year:   Transportation Needs:   . Film/video editor (Medical):   [name] Kitchen Lack of Transportation (Non-Medical):   Physical Activity:   . Days of Exercise per Week:   . Minutes of Exercise per Session:   Stress:   . Feeling of Stress :   Social Connections:   . Frequency of Communication with Friends and Family:   . Frequency of Social Gatherings with Friends and Family:   . Attends Religious Services:   . Active Member of Clubs or Organizations:   . Attends Archivist Meetings:   [name] Kitchen Marital Status:   Intimate Partner Violence:   . Fear of Current or Ex-Partner:   . Emotionally Abused:   [name] Kitchen Physically Abused:   . Sexually Abused:      Physical Exam   Vitals:   09/22/19 1732 09/22/19 2000  BP: (!) 129/57 129/62  Pulse: (!) 57 (!) 56  Resp: 16 20  Temp:    SpO2: 98% 92%    CONSTITUTIONAL: Well-appearing, NAD NEURO:  Alert and oriented x 3, no focal deficits EYES:  eyes equal and reactive ENT/NECK:  no LAD, no JVD CARDIO: Regular rate, well-perfused, normal S1 and S2 PULM:  CTAB no wheezing or rhonchi GI/GU:  normal bowel sounds, non-distended, non-tender MSK/SPINE:  No gross deformities, no edema SKIN:  no rash, atraumatic PSYCH:  Appropriate speech and behavior  *Additional and/or pertinent findings included in MDM below  Diagnostic and Interventional Summary    EKG Interpretation  Date/Time:    Ventricular Rate:    PR Interval:    QRS Duration:   QT Interval:    QTC Calculation:   R Axis:     Text Interpretation:        Labs Reviewed  CBC - Abnormal; Notable for the following components:      Result Value   RBC 3.81 (*)    All other components within normal limits  BASIC METABOLIC PANEL - Abnormal; Notable for the following components:   Glucose, Bld 104 (*)    Creatinine, Ser 1.25 (*)    GFR calc non Af Amer 38 (*)    GFR calc Af Amer 44 (*)     All other components within normal limits    CT ABDOMEN PELVIS W CONTRAST  Final Result      Medications  iohexol (OMNIPAQUE) 300 MG/ML solution 100 mL (80 mLs Intravenous Contrast Given 09/22/19 1902)     Procedures  /  Critical Care Procedures  ED Course and Medical Decision Making  I have reviewed the triage vital signs, the nursing notes, and pertinent available records from the EMR.  Listed above are laboratory and imaging tests that I personally ordered, reviewed, and interpreted and then considered in my medical decision making (see below for details).      Initially there was concern for vaginal bleeding however during her exam 3 days ago she had a thorough pelvic exam without blood and there was clear visualization of hematuria and clots coming from the urethra.  I suspect she has continued hematuria as she is anticoagulated.  Her urine culture has returned with greater than 100,000 colony-forming units of lactobacillus.  UTIs caused by lactobacillus are uncommon, will discuss with pharmacy to determine best treatment.  Patient has been taking Keflex.  Patient is with reassuring vital signs, doubt significant blood loss.  Discussed with family, who is able to provide further history.  Facility is still concerned that she is bleeding, possibly from multiple sites.  Given this concern, I performed a bimanual exam as well as rectal exam which revealed no gross blood.  She does have blood in her depend and blood stains throughout her perineum but it seems to be trickling down from her urethra.  My exam also revealed scattered nonblanching macular rash to her torso and upper thighs which could be petechiae, will obtain a CBC to exclude new or worsening thrombocytopenia.  Given the large quantity of lactobacillus in the urine and the presence of thin cloudy fluid in the vagina that during bimanual that could've been urine, will obtain CT imaging to evaluate for possible vaginal  vesicular fistula.  CT is without evidence of fistula, there is multiple bladder diverticula, there is some right-sided hydronephrosis likely due to blood in the bladder.  Patient is without pain, she is having bowel movements today and so I doubt a clinical fecal impaction.  She is appropriate for urology follow-up.  Elmer Sow. Pilar Plate, MD [facility] Emergency Medicine [facility] [email or web address]  Final Clinical Impressions(s) / ED Diagnoses     ICD-10-CM   1. Hematuria, unspecified type  R31.9     ED Discharge Orders    None  Discharge Instructions Discussed with and Provided to Patient:     Discharge Instructions     You were evaluated in the Emergency Department and after careful evaluation, we did not find any emergent condition requiring admission or further testing in the hospital.  Your exam/testing today is overall reassuring.  We advise close follow-up with the urologist for further recommendations.  Continue the Keflex medication as directed.  Please return to the Emergency Department if you experience any worsening of your condition.  We encourage you to follow up with a primary care provider.  Thank you for allowing [place] to be a part of your care.       Sabas Sous, MD 09/22/19 2019

## 2019-09-22 NOTE — Discharge Instructions (Addendum)
You were evaluated in the Emergency Department and after careful evaluation, we did not find any emergent condition requiring admission or further testing in the hospital.  Your exam/testing today is overall reassuring.  We advise close follow-up with the urologist for further recommendations.  Continue the Keflex medication as directed.  Please return to the Emergency Department if you experience any worsening of your condition.  We encourage you to follow up with a primary care provider.  Thank you for allowing [place] to be a part of your care.

## 2020-03-23 ENCOUNTER — Emergency Department (HOSPITAL_COMMUNITY): Payer: Medicare PPO

## 2020-03-23 ENCOUNTER — Inpatient Hospital Stay (HOSPITAL_COMMUNITY)
Admission: EM | Admit: 2020-03-23 | Discharge: 2020-03-28 | DRG: 871 | Disposition: A | Discharge status: Skilled Nursing Facility | Payer: Medicare PPO | Attending: Internal Medicine | Admitting: Internal Medicine

## 2020-03-23 ENCOUNTER — Encounter (HOSPITAL_COMMUNITY): Payer: Self-pay | Admitting: Internal Medicine

## 2020-03-23 DIAGNOSIS — G9341 Metabolic encephalopathy: Secondary | ICD-10-CM | POA: Diagnosis present

## 2020-03-23 DIAGNOSIS — R6520 Severe sepsis without septic shock: Secondary | ICD-10-CM | POA: Diagnosis present

## 2020-03-23 DIAGNOSIS — I4891 Unspecified atrial fibrillation: Secondary | ICD-10-CM | POA: Diagnosis present

## 2020-03-23 DIAGNOSIS — L89612 Pressure ulcer of right heel, stage 2: Secondary | ICD-10-CM | POA: Diagnosis present

## 2020-03-23 DIAGNOSIS — R1310 Dysphagia, unspecified: Secondary | ICD-10-CM | POA: Diagnosis present

## 2020-03-23 DIAGNOSIS — N183 Chronic kidney disease, stage 3 unspecified: Secondary | ICD-10-CM

## 2020-03-23 DIAGNOSIS — N1832 Chronic kidney disease, stage 3b: Secondary | ICD-10-CM | POA: Diagnosis present

## 2020-03-23 DIAGNOSIS — F0393 Unspecified dementia, unspecified severity, with mood disturbance: Secondary | ICD-10-CM | POA: Diagnosis present

## 2020-03-23 DIAGNOSIS — A419 Sepsis, unspecified organism: Secondary | ICD-10-CM | POA: Diagnosis present

## 2020-03-23 DIAGNOSIS — L89312 Pressure ulcer of right buttock, stage 2: Secondary | ICD-10-CM | POA: Diagnosis present

## 2020-03-23 DIAGNOSIS — F32A Depression, unspecified: Secondary | ICD-10-CM | POA: Diagnosis not present

## 2020-03-23 DIAGNOSIS — Z20822 Contact with and (suspected) exposure to covid-19: Secondary | ICD-10-CM | POA: Diagnosis present

## 2020-03-23 DIAGNOSIS — F329 Major depressive disorder, single episode, unspecified: Secondary | ICD-10-CM | POA: Diagnosis present

## 2020-03-23 DIAGNOSIS — Z79899 Other long term (current) drug therapy: Secondary | ICD-10-CM

## 2020-03-23 DIAGNOSIS — N390 Urinary tract infection, site not specified: Secondary | ICD-10-CM | POA: Diagnosis present

## 2020-03-23 DIAGNOSIS — Z7901 Long term (current) use of anticoagulants: Secondary | ICD-10-CM | POA: Diagnosis not present

## 2020-03-23 DIAGNOSIS — F0390 Unspecified dementia without behavioral disturbance: Secondary | ICD-10-CM | POA: Diagnosis present

## 2020-03-23 DIAGNOSIS — N3000 Acute cystitis without hematuria: Secondary | ICD-10-CM

## 2020-03-23 DIAGNOSIS — J302 Other seasonal allergic rhinitis: Secondary | ICD-10-CM | POA: Diagnosis present

## 2020-03-23 DIAGNOSIS — Z993 Dependence on wheelchair: Secondary | ICD-10-CM | POA: Diagnosis not present

## 2020-03-23 DIAGNOSIS — I129 Hypertensive chronic kidney disease with stage 1 through stage 4 chronic kidney disease, or unspecified chronic kidney disease: Secondary | ICD-10-CM | POA: Diagnosis present

## 2020-03-23 DIAGNOSIS — Z87440 Personal history of urinary (tract) infections: Secondary | ICD-10-CM | POA: Diagnosis not present

## 2020-03-23 DIAGNOSIS — R32 Unspecified urinary incontinence: Secondary | ICD-10-CM | POA: Diagnosis present

## 2020-03-23 DIAGNOSIS — Z66 Do not resuscitate: Secondary | ICD-10-CM | POA: Diagnosis present

## 2020-03-23 DIAGNOSIS — I10 Essential (primary) hypertension: Secondary | ICD-10-CM

## 2020-03-23 DIAGNOSIS — N1831 Chronic kidney disease, stage 3a: Secondary | ICD-10-CM | POA: Diagnosis not present

## 2020-03-23 DIAGNOSIS — R531 Weakness: Secondary | ICD-10-CM | POA: Diagnosis not present

## 2020-03-23 LAB — HEPARIN LEVEL (UNFRACTIONATED): Heparin Unfractionated: >2.2 IU/mL — ABNORMAL HIGH (ref 0.30–0.70)

## 2020-03-23 LAB — CBC WITH DIFFERENTIAL/PLATELET
Abs Immature Granulocytes: 0.09 10*3/uL — ABNORMAL HIGH (ref 0.00–0.07)
Basophils Absolute: 0.1 10*3/uL (ref 0.0–0.1)
Basophils Relative: 0 %
Eosinophils Absolute: 0.1 10*3/uL (ref 0.0–0.5)
Eosinophils Relative: 1 %
HCT: 33.3 % — ABNORMAL LOW (ref 36.0–46.0)
Hemoglobin: 10.6 g/dL — ABNORMAL LOW (ref 12.0–15.0)
Immature Granulocytes: 1 %
Lymphocytes Relative: 8 %
Lymphs Abs: 1.2 10*3/uL (ref 0.7–4.0)
MCH: 29.9 pg (ref 26.0–34.0)
MCHC: 31.8 g/dL (ref 30.0–36.0)
MCV: 94.1 fL (ref 80.0–100.0)
Monocytes Absolute: 0.6 10*3/uL (ref 0.1–1.0)
Monocytes Relative: 4 %
Neutro Abs: 12.1 10*3/uL — ABNORMAL HIGH (ref 1.7–7.7)
Neutrophils Relative %: 86 %
Platelets: 409 10*3/uL — ABNORMAL HIGH (ref 150–400)
RBC: 3.54 MIL/uL — ABNORMAL LOW (ref 3.87–5.11)
RDW: 13.5 % (ref 11.5–15.5)
WBC: 14.1 10*3/uL — ABNORMAL HIGH (ref 4.0–10.5)
nRBC: 0 % (ref 0.0–0.2)

## 2020-03-23 LAB — COMPREHENSIVE METABOLIC PANEL
ALT: 14 U/L (ref 0–44)
AST: 19 U/L (ref 15–41)
Albumin: 3.4 g/dL — ABNORMAL LOW (ref 3.5–5.0)
Alkaline Phosphatase: 55 U/L (ref 38–126)
Anion gap: 10 (ref 5–15)
BUN: 22 mg/dL (ref 8–23)
CO2: 26 mmol/L (ref 22–32)
Calcium: 9.2 mg/dL (ref 8.9–10.3)
Chloride: 104 mmol/L (ref 98–111)
Creatinine, Ser: 1.44 mg/dL — ABNORMAL HIGH (ref 0.44–1.00)
GFR, Estimated: 35 mL/min — ABNORMAL LOW (ref >60)
Glucose, Bld: 98 mg/dL (ref 70–99)
Potassium: 4.9 mmol/L (ref 3.5–5.1)
Sodium: 140 mmol/L (ref 135–145)
Total Bilirubin: 0.6 mg/dL (ref 0.3–1.2)
Total Protein: 8.1 g/dL (ref 6.5–8.1)

## 2020-03-23 LAB — RESPIRATORY PANEL BY RT PCR (FLU A&B, COVID)
Influenza A by PCR: NEGATIVE
Influenza B by PCR: NEGATIVE
SARS Coronavirus 2 by RT PCR: NEGATIVE

## 2020-03-23 LAB — URINALYSIS, ROUTINE W REFLEX MICROSCOPIC
Bilirubin Urine: NEGATIVE
Glucose, UA: NEGATIVE mg/dL
Ketones, ur: NEGATIVE mg/dL
Nitrite: NEGATIVE
Protein, ur: NEGATIVE mg/dL
Specific Gravity, Urine: 1.009 (ref 1.005–1.030)
WBC, UA: >50 WBC/hpf — ABNORMAL HIGH (ref 0–5)
pH: 7 (ref 5.0–8.0)

## 2020-03-23 LAB — LIPASE, BLOOD: Lipase: 28 U/L (ref 11–51)

## 2020-03-23 LAB — LACTIC ACID, PLASMA
Lactic Acid, Venous: 1.5 mmol/L (ref 0.5–1.9)
Lactic Acid, Venous: 2 mmol/L (ref 0.5–1.9)

## 2020-03-23 LAB — APTT: aPTT: 39 seconds — ABNORMAL HIGH (ref 24–36)

## 2020-03-23 MED ORDER — SODIUM CHLORIDE 0.9 % IV BOLUS (SEPSIS)
1000.0000 mL | Freq: Once | INTRAVENOUS | Status: AC
  Administered 2020-03-23: 1000 mL via INTRAVENOUS

## 2020-03-23 MED ORDER — METOPROLOL TARTRATE 5 MG/5ML IV SOLN
2.5000 mg | Freq: Four times a day (QID) | INTRAVENOUS | Status: DC
  Administered 2020-03-25 (×2): 2.5 mg via INTRAVENOUS
  Filled 2020-03-23 (×2): qty 5

## 2020-03-23 MED ORDER — SODIUM CHLORIDE 0.9 % IV SOLN
1.0000 g | INTRAVENOUS | Status: DC
  Administered 2020-03-23 – 2020-03-24 (×2): 1 g via INTRAVENOUS
  Filled 2020-03-23 (×3): qty 10

## 2020-03-23 MED ORDER — ACETAMINOPHEN 325 MG PO TABS
650.0000 mg | ORAL_TABLET | Freq: Once | ORAL | Status: DC
  Filled 2020-03-23: qty 2

## 2020-03-23 MED ORDER — SODIUM CHLORIDE 0.9 % IV SOLN: 1.0000 g | INTRAVENOUS | Status: DC

## 2020-03-23 MED ORDER — SODIUM CHLORIDE 0.9% FLUSH
3.0000 mL | Freq: Two times a day (BID) | INTRAVENOUS | Status: DC
  Administered 2020-03-24 – 2020-03-28 (×8): 3 mL via INTRAVENOUS

## 2020-03-23 MED ORDER — ACETAMINOPHEN 650 MG RE SUPP
650.0000 mg | Freq: Once | RECTAL | Status: AC
  Administered 2020-03-23: 650 mg via RECTAL
  Filled 2020-03-23: qty 1

## 2020-03-23 MED ORDER — HEPARIN (PORCINE) 25000 UT/250ML-% IV SOLN
950.0000 [IU]/h | INTRAVENOUS | Status: DC
  Administered 2020-03-23: 950 [IU]/h via INTRAVENOUS
  Filled 2020-03-23: qty 250

## 2020-03-23 MED ORDER — SODIUM CHLORIDE 0.9 % IV BOLUS
500.0000 mL | Freq: Once | INTRAVENOUS | Status: AC
  Administered 2020-03-23: 500 mL via INTRAVENOUS

## 2020-03-23 NOTE — ED Notes (Signed)
Called to give report/ no answer. Will try back in 15

## 2020-03-23 NOTE — ED Notes (Signed)
Attempted to give patient PO Tylenol, but patient unable to tell me if she can swallow pills and was making no attempts to try to take the Tylenol.

## 2020-03-23 NOTE — Progress Notes (Signed)
Korryn, Pancoast. (Randy)-Pt's Son- [phone]  Pt.'s daughter in law-Carolyn Suriano [phone]

## 2020-03-23 NOTE — Sepsis Progress Note (Signed)
Elink following this Code Sepsis. 

## 2020-03-23 NOTE — H&P (Signed)
History and Physical        Hospital Admission Note Date: 03/23/2020  Patient name: Veronica Flowers Medical record number: [id number] Date of birth: 07/27/1927 Age: 86 y.o. Gender: female  PCP: [facility]  Patient coming from: ALF, wheelchair-bound  Chief Complaint    Chief Complaint  Patient presents with  . Weakness      HPI:   History obtained from daughter-in-law at bedside and prior notes as patient is unable to provide history  This is an 86 year old female who is wheelchair-bound with past medical history of atrial fibrillation, CKD 3, hypertension, UTIs who was brought in from [place] nursing home with altered mental status and increased lethargy for the past day.  According to the daughter-in-law, patient has been having a decline in her mental status over the past 6 months and at baseline is able to have a conversation but noted that this morning when she and her husband went to visit, the patient was unable to speak at all and seemed pale.  She was told that the patient had low blood pressure at the facility however there is no record of her BP at this time.  She was transferred via EMS to the hospital for further work-up.  Of note the patient also received her COVID-19 booster yesterday.  ED Course: T1 101.1 F, HR 89, RR 26, hemodynamically stable on room air.  Notable labs: BUN 22, creatinine 1.44 (previously 1.25 in May and 0.97 last year), WBC 14.1, UA positive for UTI.  CT head unremarkable for acute changes, CXR unremarkable.  She was given ceftriaxone, 500 cc NS bolus and Tylenol.  Vitals:   03/23/20 1515 03/23/20 1633  BP: (!) 120/51 (!) 128/53  Pulse:  69  Resp:  (!) 30  Temp:  99.8 F (37.7 C)  SpO2:  100%     Review of Systems:  Review of Systems  Unable to perform ROS: Mental status change    Medical/Social/Family History    Past Medical History: Past Medical History:  Diagnosis Date  . A-fib (HCC)   . Falls   . Femur fracture, left (HCC)    supracondylar  . Hypertension   . Major depressive disorder   . Nausea   . Seasonal allergies     Past Surgical History:  Procedure Laterality Date  . FRACTURE SURGERY     left femur  . ORIF FEMUR FRACTURE Left 09/11/2018   Procedure: OPEN REDUCTION INTERNAL FIXATION (ORIF) DISTAL FEMUR FRACTURE;  Surgeon: Sheral Apley, MD;  Location: [facility] OR;  Service: Orthopedics;  Laterality: Left;    Medications: Prior to Admission medications   Medication Sig Start Date End Date Taking? Authorizing Provider  acetaminophen (TYLENOL) 500 MG tablet Take 500 mg by mouth every 6 (six) hours as needed for moderate pain or headache.   Yes [provider]  apixaban (ELIQUIS) 5 MG TABS tablet Take 5 mg by mouth 2 (two) times daily.   Yes [provider]  ARIPiprazole (ABILIFY) 2 MG tablet Take 2 mg by mouth daily.   Yes [provider]  ascorbic acid (VITAMIN C) 500 MG tablet Take 500 mg by mouth daily.   Yes [provider]  cholecalciferol (VITAMIN  D3) 25 MCG (1000 UT) tablet Take 1,000 Units by mouth daily.   Yes [provider]  digoxin (LANOXIN) 0.125 MG tablet Take 0.125 mg by mouth daily.   Yes [provider]  fluticasone (FLONASE) 50 MCG/ACT nasal spray Place 1 spray into both nostrils 2 (two) times a day.   Yes [provider]  furosemide (LASIX) 20 MG tablet Take 20 mg by mouth daily. 04/21/19  Yes [provider]  HYDROcodone-acetaminophen (NORCO/VICODIN) 5-325 MG tablet Take 1 tablet by mouth every 6 (six) hours as needed for moderate pain or severe pain.   Yes [provider]  loratadine (CLARITIN) 10 MG tablet Take 10 mg by mouth at bedtime.   Yes [provider]  megestrol (MEGACE) 40 MG/ML suspension Take 20 mLs by mouth daily. 03/16/20  Yes [provider]  Melatonin  3 MG CAPS Take 3 mg by mouth at bedtime.   Yes [provider]  metoprolol succinate (TOPROL-XL) 100 MG 24 hr tablet Take 100 mg by mouth daily.    Yes [provider]  Multiple Vitamins-Minerals (CENTRUM PO) Take 1 tablet by mouth daily.   Yes [provider]  polyethylene glycol (MIRALAX / GLYCOLAX) 17 g packet Take 17 g by mouth daily.   Yes [provider]  potassium chloride (MICRO-K) 10 MEQ CR capsule Take 10 mEq by mouth daily. 04/21/19  Yes [provider]  senna (SENOKOT) 8.6 MG TABS tablet Take 1 tablet by mouth at bedtime.   Yes [provider]  sertraline (ZOLOFT) 100 MG tablet Take 150 mg by mouth daily.   Yes [provider]  traMADol (ULTRAM) 50 MG tablet Take 50 mg by mouth every 6 (six) hours as needed for moderate pain.  06/02/19  Yes [provider]  cephALEXin (KEFLEX) 250 MG capsule Take 1 capsule (250 mg total) by mouth 4 (four) times daily. Patient not taking: Reported on 03/23/2020 09/18/19   Mancel BaleWentz, Elliott, MD  feeding supplement, ENSURE ENLIVE, (ENSURE ENLIVE) LIQD Take 237 mLs by mouth 2 (two) times daily between meals. Patient not taking: Reported on 09/22/2019 05/11/19   Rolly SalterPatel, Pranav M, MD    Allergies:  No Known Allergies  Social History:  reports that she has never smoked. She has never used smokeless tobacco. She reports previous alcohol use. She reports that she does not use drugs.  Family History: Family History  Family history unknown: Yes     Objective   Physical Exam: Blood pressure (!) 128/53, pulse 69, temperature 99.8 F (37.7 C), resp. rate (!) 30, SpO2 100 %.  Physical Exam Vitals and nursing note reviewed. Exam conducted with a chaperone present.  Constitutional:      General: She is not in acute distress.    Appearance: Normal appearance. She is not ill-appearing.     Comments: Arouses and follows commands but does not verbalize or make eye contact  HENT:     Head:  Normocephalic and atraumatic.     Mouth/Throat:     Mouth: Mucous membranes are dry.  Eyes:     Conjunctiva/sclera: Conjunctivae normal.  Cardiovascular:     Rate and Rhythm: Normal rate. Rhythm irregular.  Pulmonary:     Effort: Pulmonary effort is normal.     Breath sounds: Normal breath sounds.  Abdominal:     General: Abdomen is flat.     Palpations: Abdomen is soft.  Musculoskeletal:        General: No swelling or tenderness.  Comments: Bilateral lower extremities with ACE wraps   Skin:    Coloration: Skin is not jaundiced or pale.  Neurological:     Mental Status: She is alert. She is disoriented.     LABS on Admission: I have personally reviewed all the labs and imaging below    Basic Metabolic Panel: Recent Labs  Lab 03/23/20 1240  NA 140  K 4.9  CL 104  CO2 26  GLUCOSE 98  BUN 22  CREATININE 1.44*  CALCIUM 9.2   Liver Function Tests: Recent Labs  Lab 03/23/20 1240  AST 19  ALT 14  ALKPHOS 55  BILITOT 0.6  PROT 8.1  ALBUMIN 3.4*   Recent Labs  Lab 03/23/20 1240  LIPASE 28   No results for input(s): AMMONIA in the last 168 hours. CBC: Recent Labs  Lab 03/23/20 1240  WBC 14.1*  NEUTROABS 12.1*  HGB 10.6*  HCT 33.3*  MCV 94.1  PLT 409*   Cardiac Enzymes: No results for input(s): CKTOTAL, CKMB, CKMBINDEX, TROPONINI in the last 168 hours. BNP: Invalid input(s): POCBNP CBG: No results for input(s): GLUCAP in the last 168 hours.  Radiological Exams on Admission:  CT Head Wo Contrast  Result Date: 03/23/2020 CLINICAL DATA:  Altered mental status since yesterday. EXAM: CT HEAD WITHOUT CONTRAST TECHNIQUE: Contiguous axial images were obtained from the base of the skull through the vertex without intravenous contrast. COMPARISON:  08/13/2019 FINDINGS: Brain: Age related atrophy. No focal abnormality affects the brainstem or cerebellum. Cerebral hemispheres show advanced confluent chronic small vessel ischemic changes of the white matter. No  mass, hemorrhage, hydrocephalus or extra-axial collection. Vascular: No abnormal vascular finding. Skull: Negative Sinuses/Orbits: Clear/normal Other: None IMPRESSION: No acute finding by CT. Age related atrophy. Advanced chronic small-vessel ischemic changes of the cerebral hemispheric white matter. Electronically Signed   By: Paulina Fusi M.D.   On: 03/23/2020 11:24   DG Chest Portable 1 View  Result Date: 03/23/2020 CLINICAL DATA:  Weakness EXAM: PORTABLE CHEST 1 VIEW COMPARISON:  05/08/2019 FINDINGS: Heart size remains mildly enlarged. Atherosclerotic calcification of the aortic knob. No focal airspace consolidation, pleural effusion, or pneumothorax. IMPRESSION: No active disease. Electronically Signed   By: Duanne Guess D.O.   On: 03/23/2020 11:57      EKG: Independently reviewed.    A & P   Principal Problem:   Sepsis secondary to UTI [facility]) Active Problems:   Unspecified atrial fibrillation (HCC)   Essential hypertension   Dementia, presenile with depression (HCC)   CKD (chronic kidney disease) stage 3, GFR 30-59 ml/min (HCC)   Acute metabolic encephalopathy   1. Sepsis without septic shock secondary to UTI a. Sepsis criteria: Fever, tachypnea, Lactic acid 1.5->2.0, leukocytosis and UTI b. Follow-up urine and blood cultures c. Continue IV fluids d. N.p.o. pending SLP eval  2. Acute Metabolic Encephalopathy secondary to #1 with history of Dementia and depression a. Typically able to have a conversation at baseline, currently nonverbal but responds to commands slowly b. CT brain unremarkable for acute pathology c. NPO pending SLP eval d. Follow up Digoxin level  3. Atrial fibrillation, rate controlled a. On Eliquis, digoxin and metoprolol outpatient b. Currently NPO due to AMS c. Will change eliquis to Heparin for now and metoprolol to IV d. Holding Digoxin e. Check Digoxin level  f. telemetry  4. Elevated on CKD 3b a. Creatinine has been increasing over the  past year, currently either at new baseline or developing AKI b. Follow up after IV fluids  c. Hold lasix  5. Hypertension a. Apparently was hypotensive prior to arrival - was not recorded here b. Holding PO meds c. No echo on file  6. Chronic bilateral lower extremity wounds, POA a. WOCN   DVT prophylaxis: heparin   Code Status: Prior  Diet: NPO Family Communication: Admission, patients condition and plan of care including tests being ordered have been discussed with the patient who indicates understanding and agrees with the plan and Code Status. Patient's daughter-in-law was updated  Disposition Plan: The appropriate patient status for this patient is INPATIENT. Inpatient status is judged to be reasonable and necessary in order to provide the required intensity of service to ensure the patient's safety. The patient's presenting symptoms, physical exam findings, and initial radiographic and laboratory data in the context of their chronic comorbidities is felt to place them at high risk for further clinical deterioration. Furthermore, it is not anticipated that the patient will be medically stable for discharge from the hospital within 2 midnights of admission. The following factors support the patient status of inpatient.   " The patient's presenting symptoms include AMS " The worrisome physical exam findings include nonverbal. " The initial radiographic and laboratory data are worrisome because of leukocytosis, lactic acidosis. " The chronic co-morbidities include atrial fibrillation, bilateral lower extremity wounds, recurrent UTIs.   * I certify that at the point of admission it is my clinical judgment that the patient will require inpatient hospital care spanning beyond 2 midnights from the point of admission due to high intensity of service, high risk for further deterioration and high frequency of surveillance required.*   Status is: Inpatient  Remains inpatient appropriate  because:Altered mental status, IV treatments appropriate due to intensity of illness or inability to take PO and Inpatient level of care appropriate due to severity of illness   Dispo: The patient is from: ALF              Anticipated d/c is to: ALF              Anticipated d/c date is: 3 days              Patient currently is not medically stable to d/c.      Consultants  . none  Procedures  . none  Time Spent on Admission: 66 minutes    Jae Dire, DO [facility]  03/23/2020, 4:41 PM

## 2020-03-23 NOTE — ED Notes (Signed)
Pt incontinent of medium formed brown stool. No blood noted. Pt cleaned. Rectal temp obtained and repositioned for comfort.

## 2020-03-23 NOTE — ED Notes (Signed)
Pt in bed resting, no s/s of pain or distress. Respirations even and unlabored. Will continue to monitor.

## 2020-03-23 NOTE — ED Notes (Signed)
To CT via stretcher

## 2020-03-23 NOTE — ED Triage Notes (Signed)
87 yo female BIBA from [facility] with hypotension and altered mental status since yesterday. EMS noted non-draining bandages around bilat legs but unable to secure much information from NH. Pt is wheelchair bound at baseline. Hx UTI. Received Covid booster yesterday.   #22 placed in right hand by EMS Received NS en route. BP prior to fluid was 140 systolic  Temp 100.3 RR 34 CBG 129 BP 130/60 CO2 24-30

## 2020-03-23 NOTE — Progress Notes (Signed)
Pt was transported to hospital via ambulance and will return to facility via ambulance.

## 2020-03-23 NOTE — Sepsis Progress Note (Signed)
Even though second lactic was higher than the first, another repeat lactic was not requested as the second lactic was 2, and the order bundle only requests another lactic if greater than 2.

## 2020-03-23 NOTE — Progress Notes (Signed)
ANTICOAGULATION CONSULT NOTE - Initial Consult  Pharmacy Consult for heparin Indication: atrial fibrillation while apixaban on hold  No Known Allergies  Patient Measurements:   Heparin Dosing Weight: n/a. Use TBW = 70 kg Previous ht: 5'7"  Vital Signs: Temp: 101.1 F (38.4 C) (11/10 1509) Temp Source: Rectal (11/10 1027) BP: 111/57 (11/10 1400) Pulse Rate: 71 (11/10 1345)  Labs: Recent Labs    03/23/20 1240  HGB 10.6*  HCT 33.3*  PLT 409*  CREATININE 1.44*    CrCl cannot be calculated (Unknown ideal weight.).   Medical History: Past Medical History:  Diagnosis Date  . A-fib (HCC)   . Falls   . Femur fracture, left (HCC)    supracondylar  . Hypertension   . Major depressive disorder   . Nausea   . Seasonal allergies     Medications: Apixaban 5 mg PO BID PTA -Last dose: 11/10 @ 0600  Assessment: Pt is an 41 yoF admitted from ALF with increased lethargy. Pt is prescribed apixaban PTA for atrial fibrillation. Pharmacy consulted to transition anticoagulation to heparin drip.  Baseline labs:  -Hgb: 10.6, Plt: 409 -aPTT: in process -HL: in process  Today, 03/23/20  CBC: Hgb slightly low; Plt elevated  Baseline aPTT and HL ordered. Anticipate baseline HL being falsely elevated due to recent DOAC  SCr: 1.44, CrCl ~ 29 mL/min  Last dose of apixaban reported at 0600 on 11/10  Goal of Therapy:  Heparin level 0.3-0.7 units/ml aPTT 66-102 seconds Monitor platelets by anticoagulation protocol: Yes   Plan:   No bolus since pt had dose of apixaban this morning  Initiate heparin infusion at 950 units/hr. Start 12 hours after last apixaban dose  HL/APTT in 8 hours. Once HL and aPTT correlate, can monitor using HL only  CBC, HL daily while on heparin infusion  Monitor for signs of bleeding  Cindi Carbon, PharmD 03/23/2020,3:18 PM

## 2020-03-23 NOTE — ED Notes (Signed)
Unsuccessful IV attempt, only able to obtain one set of blood cultures at this time.

## 2020-03-23 NOTE — ED Notes (Signed)
Report given to B RN

## 2020-03-23 NOTE — ED Provider Notes (Signed)
[facility]-EMERGENCY DEPT Provider Note   CSN: [id number] Arrival date & time: 03/23/20  2297     History Chief Complaint  Patient presents with  . Weakness    Veronica Flowers is a 86 y.o. female.  Provides limited history due to some memory issues.  Overall she has no complaints.  Family states that facility thought she is a little bit more lethargic this morning.  Thought she had maybe low blood pressure but vital signs were normal with EMS.  Did have Covid booster yesterday.  There have been no falls.  Patient has history of urinary tract infections and was recently on antibiotics for 1.  Patient is wheelchair-bound, lives at living facility.  Patient is on blood thinner for A. fib.  The history is provided by the patient and a relative.  Weakness Severity:  Mild Timing:  Constant Chronicity:  New Context: recent infection   Relieved by:  Nothing Worsened by:  Nothing Associated symptoms: no abdominal pain, no arthralgias, no chest pain, no cough, no dysuria, no fever, no seizures, no shortness of breath and no vomiting        Past Medical History:  Diagnosis Date  . A-fib (HCC)   . Falls   . Femur fracture, left (HCC)    supracondylar  . Hypertension   . Major depressive disorder   . Nausea   . Seasonal allergies     Patient Active Problem List   Diagnosis Date Noted  . Left knee pain 05/08/2019  . Confusion 05/08/2019  . Closed displaced supracondylar fracture of distal end of left femur without intracondylar extension (HCC) 09/11/2018  . Unspecified atrial fibrillation (HCC) 09/11/2018  . Chronic anticoagulation 09/11/2018  . Essential hypertension 09/11/2018  . Dementia, presenile with depression (HCC) 09/11/2018  . Displaced supracondylar fracture without intracondylar extension of lower end of left femur, initial encounter for closed fracture (HCC) 09/08/2018    Past Surgical History:  Procedure Laterality Date  . FRACTURE SURGERY      left femur  . ORIF FEMUR FRACTURE Left 09/11/2018   Procedure: OPEN REDUCTION INTERNAL FIXATION (ORIF) DISTAL FEMUR FRACTURE;  Surgeon: Sheral Apley, MD;  Location: [facility] OR;  Service: Orthopedics;  Laterality: Left;     OB History   No obstetric history on file.     Family History  Family history unknown: Yes    Social History   Tobacco Use  . Smoking status: Never Smoker  . Smokeless tobacco: Never Used  Vaping Use  . Vaping Use: Never used  Substance Use Topics  . Alcohol use: Not Currently  . Drug use: Never    Home Medications Prior to Admission medications   Medication Sig Start Date End Date Taking? Authorizing Provider  ARIPiprazole (ABILIFY) 2 MG tablet Take 2 mg by mouth daily.   Yes [provider]  ascorbic acid (VITAMIN C) 500 MG tablet Take 500 mg by mouth daily.   Yes [provider]  cholecalciferol (VITAMIN D3) 25 MCG (1000 UT) tablet Take 1,000 Units by mouth daily.   Yes [provider]  digoxin (LANOXIN) 0.125 MG tablet Take 0.125 mg by mouth daily.   Yes [provider]  furosemide (LASIX) 20 MG tablet Take 20 mg by mouth daily. 04/21/19  Yes [provider]  loratadine (CLARITIN) 10 MG tablet Take 10 mg by mouth at bedtime.   Yes [provider]  Melatonin 3 MG CAPS Take 3 mg by mouth at bedtime.   Yes  [provider]  Multiple Vitamins-Minerals (CENTRUM PO) Take 1 tablet by mouth daily.   Yes [provider]  polyethylene glycol (MIRALAX / GLYCOLAX) 17 g packet Take 17 g by mouth daily.   Yes [provider]  potassium chloride (MICRO-K) 10 MEQ CR capsule Take 10 mEq by mouth daily. 04/21/19  Yes [provider]  acetaminophen (TYLENOL) 500 MG tablet Take 500 mg by mouth every 6 (six) hours as needed for moderate pain or headache.    [provider]  apixaban (ELIQUIS) 5 MG TABS tablet Take 5 mg by mouth 2 (two) times daily.    [provider]   cephALEXin (KEFLEX) 250 MG capsule Take 1 capsule (250 mg total) by mouth 4 (four) times daily. 09/18/19   Mancel BaleWentz, Elliott, MD  feeding supplement, ENSURE ENLIVE, (ENSURE ENLIVE) LIQD Take 237 mLs by mouth 2 (two) times daily between meals. Patient not taking: Reported on 09/22/2019 05/11/19   Rolly SalterPatel, Pranav M, MD  fluticasone [facility](FLONASE) 50 MCG/ACT nasal spray Place 1 spray into both nostrils 2 (two) times a day.    [provider]  HYDROcodone-acetaminophen (NORCO/VICODIN) 5-325 MG tablet Take 1 tablet by mouth every 6 (six) hours as needed for moderate pain or severe pain.    [provider]  LORazepam (ATIVAN) 0.5 MG tablet Take 0.5 mg by mouth daily.    [provider]  megestrol (MEGACE) 40 MG/ML suspension Take 20 mLs by mouth daily. 03/16/20   [provider]  Menthol, Topical Analgesic, (BIOFREEZE) 4 % GEL Apply 1 application topically 3 (three) times daily. Apply to left hip,knee, and thigh    [provider]  metoprolol succinate (TOPROL-XL) 100 MG 24 hr tablet Take 100 mg by mouth daily.     [provider]  senna (SENOKOT) 8.6 MG TABS tablet Take 1 tablet by mouth at bedtime.    [provider]  sertraline (ZOLOFT) 100 MG tablet Take 150 mg by mouth daily.    [provider]  traMADol (ULTRAM) 50 MG tablet Take 50 mg by mouth 4 (four) times daily as needed. 06/02/19   [provider]    Allergies    Patient has no known allergies.  Review of Systems   Review of Systems  Constitutional: Negative for chills and fever.  HENT: Negative for ear pain and sore throat.   Eyes: Negative for pain and visual disturbance.  Respiratory: Negative for cough and shortness of breath.   Cardiovascular: Negative for chest pain and palpitations.  Gastrointestinal: Negative for abdominal pain and vomiting.  Genitourinary: Negative for dysuria and hematuria.  Musculoskeletal: Negative for arthralgias and back pain.  Skin:  Negative for color change and rash.  Neurological: Positive for weakness. Negative for seizures and syncope.  All other systems reviewed and are negative.   Physical Exam Updated Vital Signs  ED Triage Vitals  Enc Vitals Group     BP 03/23/20 1027 (!) 134/59     Pulse Rate 03/23/20 1027 89     Resp 03/23/20 1027 (!) 26     Temp 03/23/20 1027 (!) 101.1 F (38.4 C)     Temp Source 03/23/20 1027 Rectal     SpO2 03/23/20 1027 98 %     Weight --      Height --      Head Circumference --      Peak Flow --      Pain Score 03/23/20 1030 0     Pain Loc --  Pain Edu? --      Excl. in GC? --     Physical Exam Vitals and nursing note reviewed.  Constitutional:      General: She is not in acute distress.    Appearance: She is well-developed. She is not ill-appearing.  HENT:     Head: Normocephalic and atraumatic.     Nose: Nose normal.     Mouth/Throat:     Mouth: Mucous membranes are moist.  Eyes:     Extraocular Movements: Extraocular movements intact.     Conjunctiva/sclera: Conjunctivae normal.     Pupils: Pupils are equal, round, and reactive to light.  Cardiovascular:     Rate and Rhythm: Normal rate and regular rhythm.     Pulses: Normal pulses.     Heart sounds: Normal heart sounds. No murmur heard.   Pulmonary:     Effort: Pulmonary effort is normal. No respiratory distress.     Breath sounds: Normal breath sounds.  Abdominal:     Palpations: Abdomen is soft.     Tenderness: There is no abdominal tenderness.  Musculoskeletal:     Cervical back: Normal range of motion and neck supple.  Skin:    General: Skin is warm and dry.  Neurological:     Mental Status: She is alert. Mental status is at baseline.     Comments: Patient is awake and can answer yes and no questions, appears to move upper extremities well, lower extremities atrophied with bandages around     ED Results / Procedures / Treatments   Labs (all labs ordered are listed, but only abnormal  results are displayed) Labs Reviewed  URINALYSIS, ROUTINE W REFLEX MICROSCOPIC - Abnormal; Notable for the following components:      Result Value   APPearance CLOUDY (*)    Hgb urine dipstick MODERATE (*)    Leukocytes,Ua LARGE (*)    WBC, UA >50 (*)    Bacteria, UA MANY (*)    All other components within normal limits  CBC WITH DIFFERENTIAL/PLATELET - Abnormal; Notable for the following components:   WBC 14.1 (*)    RBC 3.54 (*)    Hemoglobin 10.6 (*)    HCT 33.3 (*)    Platelets 409 (*)    Neutro Abs 12.1 (*)    Abs Immature Granulocytes 0.09 (*)    All other components within normal limits  COMPREHENSIVE METABOLIC PANEL - Abnormal; Notable for the following components:   Creatinine, Ser 1.44 (*)    Albumin 3.4 (*)    GFR, Estimated 35 (*)    All other components within normal limits  URINE CULTURE  RESPIRATORY PANEL BY RT PCR (FLU A&B, COVID)  CULTURE, BLOOD (ROUTINE X 2)  CULTURE, BLOOD (ROUTINE X 2)  LIPASE, BLOOD  LACTIC ACID, PLASMA  LACTIC ACID, PLASMA    EKG EKG Interpretation  Date/Time:  Wednesday March 23 2020 10:16:23 EST Ventricular Rate:  72 PR Interval:    QRS Duration: 117 QT Interval:  341 QTC Calculation: 374 R Axis:   136 Text Interpretation: Atrial fibrillation Multi interpolated vent premature complexes Nonspecific intraventricular conduction delay Borderline ST depression, diffuse leads Confirmed by Virgina Norfolk [phone]) on 03/23/2020 10:41:50 AM Also confirmed by Virgina Norfolk [id number]), editor Elita Quick ([id number])  on 03/23/2020 12:21:58 PM   Radiology CT Head Wo Contrast  Result Date: 03/23/2020 CLINICAL DATA:  Altered mental status since yesterday. EXAM: CT HEAD WITHOUT CONTRAST TECHNIQUE: Contiguous axial images were obtained from the base of the skull  through the vertex without intravenous contrast. COMPARISON:  08/13/2019 FINDINGS: Brain: Age related atrophy. No focal abnormality affects the brainstem or cerebellum. Cerebral  hemispheres show advanced confluent chronic small vessel ischemic changes of the white matter. No mass, hemorrhage, hydrocephalus or extra-axial collection. Vascular: No abnormal vascular finding. Skull: Negative Sinuses/Orbits: Clear/normal Other: None IMPRESSION: No acute finding by CT. Age related atrophy. Advanced chronic small-vessel ischemic changes of the cerebral hemispheric white matter. Electronically Signed   By: Paulina Fusi M.D.   On: 03/23/2020 11:24   DG Chest Portable 1 View  Result Date: 03/23/2020 CLINICAL DATA:  Weakness EXAM: PORTABLE CHEST 1 VIEW COMPARISON:  05/08/2019 FINDINGS: Heart size remains mildly enlarged. Atherosclerotic calcification of the aortic knob. No focal airspace consolidation, pleural effusion, or pneumothorax. IMPRESSION: No active disease. Electronically Signed   By: Duanne Guess D.O.   On: 03/23/2020 11:57    Procedures .Critical Care Performed by: Virgina Norfolk, DO Authorized by: Virgina Norfolk, DO   Critical care provider statement:    Critical care time (minutes):  45   Critical care was necessary to treat or prevent imminent or life-threatening deterioration of the following conditions:  Sepsis   Critical care was time spent personally by me on the following activities:  Blood draw for specimens, development of treatment plan with patient or surrogate, discussions with primary provider, evaluation of patient's response to treatment, examination of patient, obtaining history from patient or surrogate, ordering and performing treatments and interventions, ordering and review of laboratory studies, ordering and review of radiographic studies, re-evaluation of patient's condition, pulse oximetry and review of old charts   I assumed direction of critical care for this patient from another provider in my specialty: no     (including critical care time)  Medications Ordered in ED Medications  cefTRIAXone (ROCEPHIN) 1 g in sodium chloride 0.9 % 100  mL IVPB (has no administration in time range)  acetaminophen (TYLENOL) suppository 650 mg (has no administration in time range)  sodium chloride 0.9 % bolus 500 mL (500 mLs Intravenous New Bag/Given 03/23/20 1238)    ED Course  I have reviewed the triage vital signs and the nursing notes.  Pertinent labs & imaging results that were available during my care of the patient were reviewed by me and considered in my medical decision making (see chart for details).    MDM Rules/Calculators/A&P                          Veronica Flowers is an 86 year old female with history of memory problems who presents the ED with altered mental status.  Febrile to 101.1 but otherwise vitals are unremarkable.  Overall she appears well.  Did have her Covid booster yesterday.  Suspect likely sepsis versus vaccine side effect.  Will hold off on antibiotics at this time as vital signs are reassuring despite fever.  Has had frequent urinary tract infections in the past as well.  Neurologically appears to be at her baseline but will get a head CT as patient is on blood thinner.  Urinalysis consistent with infection. White count of 14. Sepsis not likely the cause of her altered mental status. Given a gram of IV Rocephin and will admit for further sepsis care. Otherwise no significant electrolyte abnormality. Creatinine 1.4. Chest x-ray without signs of infection. Head CT without any acute findings. Hemodynamically stable to be admitted for further care. Patient is DNR.  This chart was dictated using voice recognition software.  Despite best efforts to proofread,  errors can occur which can change the documentation meaning.   Veronica Flowers was evaluated in Emergency Department on 03/23/2020 for the symptoms described in the history of present illness. She was evaluated in the context of the global COVID-19 pandemic, which necessitated consideration that the patient might be at risk for infection with the SARS-CoV-2 virus that  causes COVID-19. Institutional protocols and algorithms that pertain to the evaluation of patients at risk for COVID-19 are in a state of rapid change based on information released by regulatory bodies including the CDC and federal and state organizations. These policies and algorithms were followed during the patient's care in the ED.   Final Clinical Impression(s) / ED Diagnoses Final diagnoses:  Sepsis, due to unspecified organism, unspecified whether acute organ dysfunction present [facility])  Acute cystitis without hematuria    Rx / DC Orders ED Discharge Orders    None       Virgina Norfolk, DO 03/23/20 1335

## 2020-03-24 ENCOUNTER — Other Ambulatory Visit: Payer: Self-pay

## 2020-03-24 DIAGNOSIS — N3000 Acute cystitis without hematuria: Secondary | ICD-10-CM

## 2020-03-24 LAB — BLOOD CULTURE ID PANEL (REFLEXED) - BCID2

## 2020-03-24 LAB — BASIC METABOLIC PANEL
Anion gap: 10 (ref 5–15)
BUN: 19 mg/dL (ref 8–23)
CO2: 22 mmol/L (ref 22–32)
Calcium: 8.7 mg/dL — ABNORMAL LOW (ref 8.9–10.3)
Chloride: 106 mmol/L (ref 98–111)
Creatinine, Ser: 1.1 mg/dL — ABNORMAL HIGH (ref 0.44–1.00)
GFR, Estimated: 48 mL/min — ABNORMAL LOW (ref >60)
Glucose, Bld: 95 mg/dL (ref 70–99)
Potassium: 4.2 mmol/L (ref 3.5–5.1)
Sodium: 138 mmol/L (ref 135–145)

## 2020-03-24 LAB — HEPARIN LEVEL (UNFRACTIONATED): Heparin Unfractionated: >2.2 IU/mL — ABNORMAL HIGH (ref 0.30–0.70)

## 2020-03-24 LAB — URINE CULTURE: Culture: >=100000 — AB

## 2020-03-24 LAB — CBC
HCT: 29.2 % — ABNORMAL LOW (ref 36.0–46.0)
Hemoglobin: 9 g/dL — ABNORMAL LOW (ref 12.0–15.0)
MCH: 29.2 pg (ref 26.0–34.0)
MCHC: 30.8 g/dL (ref 30.0–36.0)
MCV: 94.8 fL (ref 80.0–100.0)
Platelets: 326 10*3/uL (ref 150–400)
RBC: 3.08 MIL/uL — ABNORMAL LOW (ref 3.87–5.11)
RDW: 13.6 % (ref 11.5–15.5)
WBC: 10 10*3/uL (ref 4.0–10.5)
nRBC: 0 % (ref 0.0–0.2)

## 2020-03-24 LAB — DIGOXIN LEVEL: Digoxin Level: 1.4 ng/mL (ref 1.0–2.0)

## 2020-03-24 LAB — APTT
aPTT: 106 seconds — ABNORMAL HIGH (ref 24–36)
aPTT: 88 seconds — ABNORMAL HIGH (ref 24–36)

## 2020-03-24 MED ORDER — HEPARIN (PORCINE) 25000 UT/250ML-% IV SOLN: 850.0000 [IU]/h | INTRAVENOUS | Status: DC

## 2020-03-24 MED ORDER — HEPARIN (PORCINE) 25000 UT/250ML-% IV SOLN
850.0000 [IU]/h | INTRAVENOUS | Status: AC
  Administered 2020-03-24: 850 [IU]/h via INTRAVENOUS
  Filled 2020-03-24: qty 250

## 2020-03-24 MED ORDER — LACTATED RINGERS IV SOLN: INTRAVENOUS | Status: DC

## 2020-03-24 NOTE — Progress Notes (Signed)
PROGRESS NOTE  Veronica Flowers WUJ:[id number] DOB: [birth year] DOA: 03/23/2020 PCP: [facility]   LOS: 1 day   Brief Narrative / Interim history: 86 year old female, wheelchair-bound, history of A. fib, chronic kidney disease stage III, HTN, UTIs who came in from [facility] with altered mental status and increased lethargy for the past day.  Patient has had a decline in her mental status over the last 6 months, at baseline is able to carry a conversation but she was unable to speak prior to bring her to the hospital.  Patient received her COVID-19 booster today prior to admission.  In the ED she was febrile to 101.1, urinalysis was positive for UTI, she was placed on antibiotics and admitted to the hospital.  Subjective / 24h Interval events: Evaluated twice, on initial evaluation she was asleep and unarousable, later on she was alert, looked at me but not verbal  Assessment & Plan: Principal Problem Sepsis due to UTI -Continue to follow-up cultures, continue IV antibiotics -Her fever may also have been done post Covid booster but could never tell  Active Problems Acute metabolic encephalopathy due to sepsis, history of dementia -CT of the brain unremarkable for acute pathology -Speech therapy to evaluate  Rate controlled A. fib -She is n.p.o., hold Eliquis, digoxin, metoprolol.  On heparin IV and IV metoprolol  Chronic kidney disease stage IIIb -Monitor with fluids  Essential hypertension -Apparently hypotensive on arrival, blood pressure better this morning  LE wounds, POA Pressure Injury 03/23/20 Heel Right Stage 2 -  Partial thickness loss of dermis presenting as a shallow open injury with a red, pink wound bed without slough. callused area with open center and serous drainage (Active)  03/23/20 2130  Location: Heel  Location Orientation: Right  Staging: Stage 2 -  Partial thickness loss of dermis presenting as a shallow open injury with a red, pink  wound bed without slough.  Wound Description (Comments): callused area with open center and serous drainage  Present on Admission: Yes     Pressure Injury 03/23/20 Buttocks Right Stage 2 -  Partial thickness loss of dermis presenting as a shallow open injury with a red, pink wound bed without slough. 1 cm denuded area pink base no redness (Active)  03/23/20 2130  Location: Buttocks  Location Orientation: Right  Staging: Stage 2 -  Partial thickness loss of dermis presenting as a shallow open injury with a red, pink wound bed without slough.  Wound Description (Comments): 1 cm denuded area pink base no redness  Present on Admission: Yes     Scheduled Meds: . metoprolol tartrate  2.5 mg Intravenous Q6H  . sodium chloride flush  3 mL Intravenous Q12H   Continuous Infusions: . cefTRIAXone (ROCEPHIN)  IV 1 g (03/24/20 1102)  . heparin 850 Units/hr (03/24/20 0745)  . lactated ringers 75 mL/hr at 03/24/20 0153   PRN Meds:.  Diet Orders (From admission, onward)    Start     Ordered   03/23/20 1716  Diet NPO time specified  Diet effective now        03/23/20 1715          DVT prophylaxis:      Code Status: DNR  Family Communication: no family at bedside   Status is: Inpatient  Remains inpatient appropriate because:Inpatient level of care appropriate due to severity of illness   Dispo: The patient is from: SNF              Anticipated d/c is  to: SNF              Anticipated d/c date is: 2 days              Patient currently is not medically stable to d/c.   Consultants:  None   Procedures:  None   Microbiology  Blood cultures, urine cultures-pending  Antimicrobials: Ceftriaxone 11/10 >>    Objective: Vitals:   03/23/20 2257 03/23/20 2355 03/24/20 0414 03/24/20 0553  BP: (!) 129/50 (!) 104/53 (!) 120/48 (!) 120/56  Pulse: 66 72 (!) 58 80  Resp: 20 (!) 24 18   Temp: 98.1 F (36.7 C) 99.4 F (37.4 C) 98.6 F (37 C)   TempSrc: Oral  Oral   SpO2: 100% 98%  96% 98%  Weight:      Height:        Intake/Output Summary (Last 24 hours) at 03/24/2020 1146 Last data filed at 03/24/2020 6720 Gross per 24 hour  Intake 617.24 ml  Output 925 ml  Net -307.76 ml   Filed Weights   03/23/20 1734 03/23/20 2150  Weight: 70.3 kg 71.7 kg    Examination:  Constitutional: NAD Eyes: no scleral icterus ENMT: Mucous membranes are moist.  Neck: normal, supple Respiratory: clear to auscultation bilaterally, no wheezing, no crackles.  Cardiovascular: Regular rate and rhythm, no murmurs / rubs / gallops.  Abdomen: non distended, no tenderness. Bowel sounds positive.  Musculoskeletal: no clubbing / cyanosis.  Skin: no rashes Neurologic: no focal deficits  Data Reviewed: I have independently reviewed following labs and imaging studies   CBC: Recent Labs  Lab 03/23/20 1240 03/24/20 0603  WBC 14.1* 10.0  NEUTROABS 12.1*  --   HGB 10.6* 9.0*  HCT 33.3* 29.2*  MCV 94.1 94.8  PLT 409* 326   Basic Metabolic Panel: Recent Labs  Lab 03/23/20 1240 03/24/20 0603  NA 140 138  K 4.9 4.2  CL 104 106  CO2 26 22  GLUCOSE 98 95  BUN 22 19  CREATININE 1.44* 1.10*  CALCIUM 9.2 8.7*   Liver Function Tests: Recent Labs  Lab 03/23/20 1240  AST 19  ALT 14  ALKPHOS 55  BILITOT 0.6  PROT 8.1  ALBUMIN 3.4*   Coagulation Profile: No results for input(s): INR, PROTIME in the last 168 hours. HbA1C: No results for input(s): HGBA1C in the last 72 hours. CBG: No results for input(s): GLUCAP in the last 168 hours.  Recent Results (from the past 240 hour(s))  Respiratory Panel by RT PCR (Flu A&B, Covid) -     Status: None   Collection Time: 03/23/20 10:25 AM  Result Value Ref Range Status   SARS Coronavirus 2 by RT PCR NEGATIVE NEGATIVE Final    Comment: (NOTE) SARS-CoV-2 target nucleic acids are NOT DETECTED.  The SARS-CoV-2 RNA is generally detectable in upper respiratoy specimens during the acute phase of infection. The lowest concentration of  SARS-CoV-2 viral copies this assay can detect is 131 copies/mL. A negative result does not preclude SARS-Cov-2 infection and should not be used as the sole basis for treatment or other patient management decisions. A negative result may occur with  improper specimen collection/handling, submission of specimen other than nasopharyngeal swab, presence of viral mutation(s) within the areas targeted by this assay, and inadequate number of viral copies (<131 copies/mL). A negative result must be combined with clinical observations, patient history, and epidemiological information. The expected result is Negative.  Fact Sheet for Patients:  https://www.moore.com/  Fact Sheet for Healthcare  Providers:  https://www.young.biz/https://www.fda.gov/media/142435/download  This test is no t yet approved or cleared by the [place] FDA and  has been authorized for detection and/or diagnosis of SARS-CoV-2 by FDA under an Emergency Use Authorization (EUA). This EUA will remain  in effect (meaning this test can be used) for the duration of the COVID-19 declaration under Section 564(b)(1) of the Act, 21 U.S.C. section 360bbb-3(b)(1), unless the authorization is terminated or revoked sooner.     Influenza A by PCR NEGATIVE NEGATIVE Final   Influenza B by PCR NEGATIVE NEGATIVE Final    Comment: (NOTE) The Xpert Xpress SARS-CoV-2/FLU/RSV assay is intended as an aid in  the diagnosis of influenza from Nasopharyngeal swab specimens and  should not be used as a sole basis for treatment. Nasal washings and  aspirates are unacceptable for Xpert Xpress SARS-CoV-2/FLU/RSV  testing.  Fact Sheet for Patients: https://www.moore.com/https://www.fda.gov/media/142436/download  Fact Sheet for Healthcare Providers: https://www.young.biz/https://www.fda.gov/media/142435/download  This test is not yet approved or cleared by the [place] FDA and  has been authorized for detection and/or diagnosis of SARS-CoV-2 by  FDA under an Emergency Use  Authorization (EUA). This EUA will remain  in effect (meaning this test can be used) for the duration of the  Covid-19 declaration under Section 564(b)(1) of the Act, 21  U.S.C. section 360bbb-3(b)(1), unless the authorization is  terminated or revoked. Performed at [facility], [address] [place], [place] [address]   Blood culture (routine x 2)     Status: None (Preliminary result)   Collection Time: 03/23/20 12:02 PM   Specimen: BLOOD  Result Value Ref Range Status   Specimen Description   Final    BLOOD BLOOD LEFT FOREARM Performed at [facility], [address] [place], [place] [address]    Special Requests   Final    BOTTLES DRAWN AEROBIC AND ANAEROBIC Blood Culture results may not be optimal due to an inadequate volume of blood received in culture bottles Performed at [facility], [address] [place], [place] [address]    Culture   Final    NO GROWTH < 24 HOURS Performed at [facility], [address] [place], [place] [address]    Report Status PENDING  Incomplete  Urine culture     Status: None (Preliminary result)   Collection Time: 03/23/20 12:40 PM   Specimen: Urine, Clean Catch  Result Value Ref Range Status   Specimen Description   Final    URINE, CLEAN CATCH Performed at [facility], [address] [place], [place] [address]    Special Requests   Final    NONE Performed at [facility], [address] [place], [place] [address]    Culture   Final    CULTURE REINCUBATED FOR BETTER GROWTH Performed at [facility], [address] [place], [place] [address]    Report Status PENDING  Incomplete  Blood culture (routine x 2)     Status: None (Preliminary result)   Collection Time: 03/23/20 12:40 PM   Specimen: BLOOD RIGHT ARM  Result Value Ref Range Status   Specimen Description   Final    BLOOD RIGHT ARM Performed at [facility], [address] [place], [place] [address]    Special Requests   Final    BOTTLES DRAWN AEROBIC AND ANAEROBIC Blood Culture adequate volume Performed at [facility], [address] [place], [place] [address]  Culture  Setup Time   Final    GRAM POSITIVE COCCI IN CLUSTERS AEROBIC BOTTLE ONLY Organism ID to follow CRITICAL RESULT CALLED TO, READ BACK BY AND VERIFIED WITH: Elmer Sow PharmD 11:35 03/24/20 ([email or web address]) Performed at [facility], [address] [place], [place] [address]    Culture GRAM POSITIVE COCCI  Final   Report Status PENDING  Incomplete  Blood Culture ID Panel (Reflexed)     Status: Abnormal   Collection Time: 03/23/20 12:40 PM  Result Value Ref Range Status   Enterococcus faecalis NOT DETECTED NOT DETECTED Final   Enterococcus Faecium NOT DETECTED NOT DETECTED Final   Listeria monocytogenes NOT DETECTED NOT DETECTED Final   Staphylococcus species DETECTED (A) NOT DETECTED Final    Comment: CRITICAL RESULT CALLED TO, READ BACK BY AND VERIFIED WITH: Elmer Sow PharmD 11:35 03/24/20 ([email or web address])    Staphylococcus aureus (BCID) NOT DETECTED NOT DETECTED Final   Staphylococcus epidermidis DETECTED (A) NOT DETECTED Final    Comment: CRITICAL RESULT CALLED TO, READ BACK BY AND VERIFIED WITH: Elmer Sow PharmD 11:35 03/24/20 ([email or web address])    Staphylococcus lugdunensis NOT DETECTED NOT DETECTED Final   Streptococcus species NOT DETECTED NOT DETECTED Final   Streptococcus agalactiae NOT DETECTED NOT DETECTED Final   Streptococcus pneumoniae NOT DETECTED NOT DETECTED Final   Streptococcus pyogenes NOT DETECTED NOT DETECTED Final   A.calcoaceticus-baumannii NOT DETECTED NOT DETECTED Final   Bacteroides fragilis NOT DETECTED NOT DETECTED Final   Enterobacterales NOT DETECTED NOT DETECTED Final   Enterobacter cloacae complex NOT DETECTED NOT DETECTED Final   Escherichia coli NOT DETECTED NOT DETECTED Final   Klebsiella aerogenes  NOT DETECTED NOT DETECTED Final   Klebsiella oxytoca NOT DETECTED NOT DETECTED Final   Klebsiella pneumoniae NOT DETECTED NOT DETECTED Final   Proteus species NOT DETECTED NOT DETECTED Final   Salmonella species NOT DETECTED NOT DETECTED Final   Serratia marcescens NOT DETECTED NOT DETECTED Final   Haemophilus influenzae NOT DETECTED NOT DETECTED Final   Neisseria meningitidis NOT DETECTED NOT DETECTED Final   Pseudomonas aeruginosa NOT DETECTED NOT DETECTED Final   Stenotrophomonas maltophilia NOT DETECTED NOT DETECTED Final   Candida albicans NOT DETECTED NOT DETECTED Final   Candida auris NOT DETECTED NOT DETECTED Final   Candida glabrata NOT DETECTED NOT DETECTED Final   Candida krusei NOT DETECTED NOT DETECTED Final   Candida parapsilosis NOT DETECTED NOT DETECTED Final   Candida tropicalis NOT DETECTED NOT DETECTED Final   Cryptococcus neoformans/gattii NOT DETECTED NOT DETECTED Final   Methicillin resistance mecA/C NOT DETECTED NOT DETECTED Final    Comment: Performed at [facility], [address] [place], [place] [address]     Radiology Studies: DG Chest Portable 1 View  Result Date: 03/23/2020 CLINICAL DATA:  Weakness EXAM: PORTABLE CHEST 1 VIEW COMPARISON:  05/08/2019 FINDINGS: Heart size remains mildly enlarged. Atherosclerotic calcification of the aortic knob. No focal airspace consolidation, pleural effusion, or pneumothorax. IMPRESSION: No active disease. Electronically Signed   By: Duanne Guess D.O.   On: 03/23/2020 11:57    Pamella Pert, MD, PhD [facility]  Between 7 am - 7 pm I am available, please contact me via Amion or Securechat  Between 7 pm - 7 am I am not available, please contact night coverage MD/APP via Amion

## 2020-03-24 NOTE — Progress Notes (Addendum)
Pharmacy - IV heparin  Assessment:    Please see note from Terrilee Files, PharmD earlier today for full details.  Briefly, 86 y.o. female on IV heparin for afib on Eliquis PTA; now too lethargic to take PO meds.   Most recent aPTT therapeutic at 88 sec on 850 units/hr  No bleeding or infusion issues per RN  Plan:   Continue heparin at 850 units/hr  Daily CBC, heparin level, and aPTT  Bernadene Person, PharmD, BCPS [phone] 03/24/2020, 1:55 PM

## 2020-03-24 NOTE — Evaluation (Signed)
SLP Cancellation Note  Patient Details Name: Veronica Flowers MRN: [id number] DOB: [birth year]   Cancelled treatment:       Reason Eval/Treat Not Completed: Other (comment) (pt has remained drowsy per chart review, messaged RN who approved hold on swallow eval until am 11/12) [name], MS [facility] SLP Acute Rehab Services Office [phone] Pager [phone]    Chales Abrahams 03/24/2020, 6:57 PM
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
## 2020-03-24 NOTE — Progress Notes (Signed)
Scheduled Metoprolol held BP 10/53 MAP >65, on-call attending E. Ouma notified via AMION. Will continue to monitor the patient.

## 2020-03-24 NOTE — Progress Notes (Signed)
ANTICOAGULATION CONSULT NOTE   Pharmacy Consult for heparin Indication: atrial fibrillation while apixaban on hold  No Known Allergies  Patient Measurements: Height: 5\' 7"  (170.2 cm) Weight: 71.7 kg (158 lb) IBW/kg (Calculated) : 61.6 Heparin Dosing Weight: total body weight  Vital Signs: Temp: 98.6 F (37 C) (11/11 0414) Temp Source: Oral (11/11 0414) BP: 120/56 (11/11 0553) Pulse Rate: 80 (11/11 0553)  Labs: Recent Labs    03/23/20 1240 03/23/20 1745 03/24/20 0603  HGB 10.6*  --  9.0*  HCT 33.3*  --  29.2*  PLT 409*  --  326  APTT  --  39* 106*  HEPARINUNFRC  --  >2.20* >2.20*  CREATININE 1.44*  --  1.10*    Estimated Creatinine Clearance: 33.7 mL/min (A) (by C-G formula based on SCr of 1.1 mg/dL (H)).   Medical History: Past Medical History:  Diagnosis Date  . A-fib (HCC)   . Falls   . Femur fracture, left (HCC)    supracondylar  . Hypertension   . Major depressive disorder   . Nausea   . Seasonal allergies     Medications: Apixaban 5 mg PO BID PTA -Last dose: 11/10 @ 0600  Assessment: Pt is an 89 yoF admitted from ALF with increased lethargy. Pt is prescribed apixaban PTA for atrial fibrillation. Pharmacy consulted to transition anticoagulation to heparin drip.  Baseline labs:  -Hgb: 10.6, Plt: 409 -aPTT: in process -HL: in process  Today, 03/24/20  CBC: Hgb slightly low; Plt wnl  Heparin level > 2.2 (falsely elevated due to effects of Apixaban)  APTT = 106 sec (supratherapeutic) with heparin gtt infusing @ 950 units/hr  No complications of therapy noted  SCr: 1.1, CrCl ~ 33 mL/min  Last dose of apixaban reported at 0600 on 11/10  Goal of Therapy:  Heparin level 0.3-0.7 units/ml aPTT 66-102 seconds Monitor platelets by anticoagulation protocol: Yes   Plan:   Decrease heparin infusion to 850 units/hr.   Check APTT 8 hours after heparin rate decreased.   CBC, HL daily while on heparin infusion (Once HL and aPTT correlate, can  monitor using HL only)  Monitor for signs of bleeding  Vander Kueker, 13/10, PharmD 03/24/2020,7:10 AM

## 2020-03-24 NOTE — Progress Notes (Signed)
Spoke with on-call attending E.Omua via AMION and telephone. Metoprolol held d/t fluctuating HR to the low 50's. Will continue to monitor the patient.
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
## 2020-03-24 NOTE — TOC Initial Note (Signed)
Transition of Care [facility]) - Initial/Assessment Note    Patient Details  Name: Veronica Flowers MRN: [id number] Date of Birth: 1928-02-17  [facility] CM/SW Contact:    Ida Rogue, LCSW Phone Number: 03/24/2020, 2:31 PM  Clinical Narrative:   Patient from [place] where she is a long term resident.  Attempted to speak to someone at [facility]; left a message for call back.  TOC will continue to follow during the course of hospitalization.                 Expected Discharge Plan: Assisted Living Barriers to Discharge: No Barriers Identified   Patient Goals and CMS Choice        Expected Discharge Plan and Services Expected Discharge Plan: Assisted Living                                              Prior Living Arrangements/Services                       Activities of Daily Living Home Assistive Devices/Equipment: Blood pressure cuff, Grab bars around toilet, Grab bars in shower, Hand-held shower hose, Hospital bed, [employer], Wheelchair ADL Screening (condition at time of admission) Patient's cognitive ability adequate to safely complete daily activities?: No Is the patient deaf or have difficulty hearing?: Yes Does the patient have difficulty seeing, even when wearing glasses/contacts?: No Does the patient have difficulty concentrating, remembering, or making decisions?: Yes Patient able to express need for assistance with ADLs?: No Does the patient have difficulty dressing or bathing?: Yes Independently performs ADLs?: No Communication: Independent Dressing (OT): Dependent Is this a change from baseline?: Change from baseline, expected to last >3 days Grooming: Dependent Is this a change from baseline?: Change from baseline, expected to last >3 days Feeding: Dependent Is this a change from baseline?: Change from baseline, expected to last >3 days Bathing: Dependent Is this a change from baseline?: Change from baseline,  expected to last >3 days Toileting: Dependent Is this a change from baseline?: Change from baseline, expected to last >3days In/Out Bed: Dependent Is this a change from baseline?: Change from baseline, expected to last >3 days Walks in Home: Dependent (patient wheelchair bound) Is this a change from baseline?: Change from baseline, expected to last >3 days Does the patient have difficulty walking or climbing stairs?: Yes (does nnot walk) Weakness of Legs: Both Weakness of Arms/Hands: None  Permission Sought/Granted                  Emotional Assessment              Admission diagnosis:  Acute cystitis without hematuria [N30.00] Sepsis (HCC) [A41.9] Sepsis, due to unspecified organism, unspecified whether acute organ dysfunction present [facility]) [A41.9] Patient Active Problem List   Diagnosis Date Noted  . Sepsis secondary to UTI (HCC) 03/23/2020  . CKD (chronic kidney disease) stage 3, GFR 30-59 ml/min (HCC) 03/23/2020  . Acute metabolic encephalopathy 03/23/2020  . Left knee pain 05/08/2019  . Confusion 05/08/2019  . Closed displaced supracondylar fracture of distal end of left femur without intracondylar extension (HCC) 09/11/2018  . Unspecified atrial fibrillation (HCC) 09/11/2018  . Chronic anticoagulation 09/11/2018  . Essential hypertension 09/11/2018  . Dementia, presenile with depression (HCC) 09/11/2018  . Displaced supracondylar fracture without intracondylar extension of lower  end of left femur, initial encounter for closed fracture (HCC) 09/08/2018   PCP:  [facility] Pharmacy:  No Pharmacies Listed    Social Determinants of Health (SDOH) Interventions    Readmission Risk Interventions No flowsheet data found.

## 2020-03-24 NOTE — Plan of Care (Signed)
Plan of care discussed.   

## 2020-03-24 NOTE — Progress Notes (Signed)
PHARMACY - PHYSICIAN COMMUNICATION CRITICAL VALUE ALERT - BLOOD CULTURE IDENTIFICATION (BCID)  Veronica Flowers is an 86 y.o. female who presented to [facility] on 03/23/2020 with a chief complaint of AMS.  She's currently on ceftriaxone for suspected UTI. One of four blood culture has GPC in clusters (BCID= staph epi and staph species)  Name of physician (or Provider) Contacted: Dr. Elvera Lennox  Current antibiotics: ceftriaxone  Changes to prescribed antibiotics recommended:  - suspect contamination, no change in abx regimen for now  Results for orders placed or performed during the hospital encounter of 03/23/20  Blood Culture ID Panel (Reflexed) (Collected: 03/23/2020 12:40 PM)  Result Value Ref Range   Enterococcus faecalis NOT DETECTED NOT DETECTED   Enterococcus Faecium NOT DETECTED NOT DETECTED   Listeria monocytogenes NOT DETECTED NOT DETECTED   Staphylococcus species DETECTED (A) NOT DETECTED   Staphylococcus aureus (BCID) NOT DETECTED NOT DETECTED   Staphylococcus epidermidis DETECTED (A) NOT DETECTED   Staphylococcus lugdunensis NOT DETECTED NOT DETECTED   Streptococcus species NOT DETECTED NOT DETECTED   Streptococcus agalactiae NOT DETECTED NOT DETECTED   Streptococcus pneumoniae NOT DETECTED NOT DETECTED   Streptococcus pyogenes NOT DETECTED NOT DETECTED   A.calcoaceticus-baumannii NOT DETECTED NOT DETECTED   Bacteroides fragilis NOT DETECTED NOT DETECTED   Enterobacterales NOT DETECTED NOT DETECTED   Enterobacter cloacae complex NOT DETECTED NOT DETECTED   Escherichia coli NOT DETECTED NOT DETECTED   Klebsiella aerogenes NOT DETECTED NOT DETECTED   Klebsiella oxytoca NOT DETECTED NOT DETECTED   Klebsiella pneumoniae NOT DETECTED NOT DETECTED   Proteus species NOT DETECTED NOT DETECTED   Salmonella species NOT DETECTED NOT DETECTED   Serratia marcescens NOT DETECTED NOT DETECTED   Haemophilus influenzae NOT DETECTED NOT DETECTED   Neisseria meningitidis NOT DETECTED NOT  DETECTED   Pseudomonas aeruginosa NOT DETECTED NOT DETECTED   Stenotrophomonas maltophilia NOT DETECTED NOT DETECTED   Candida albicans NOT DETECTED NOT DETECTED   Candida auris NOT DETECTED NOT DETECTED   Candida glabrata NOT DETECTED NOT DETECTED   Candida krusei NOT DETECTED NOT DETECTED   Candida parapsilosis NOT DETECTED NOT DETECTED   Candida tropicalis NOT DETECTED NOT DETECTED   Cryptococcus neoformans/gattii NOT DETECTED NOT DETECTED   Methicillin resistance mecA/C NOT DETECTED NOT DETECTED    [name] 03/24/2020  11:51 AM

## 2020-03-25 LAB — CBC
HCT: 27.4 % — ABNORMAL LOW (ref 36.0–46.0)
Hemoglobin: 8.3 g/dL — ABNORMAL LOW (ref 12.0–15.0)
MCH: 29.3 pg (ref 26.0–34.0)
MCHC: 30.3 g/dL (ref 30.0–36.0)
MCV: 96.8 fL (ref 80.0–100.0)
Platelets: 314 10*3/uL (ref 150–400)
RBC: 2.83 MIL/uL — ABNORMAL LOW (ref 3.87–5.11)
RDW: 13.4 % (ref 11.5–15.5)
WBC: 9.8 10*3/uL (ref 4.0–10.5)
nRBC: 0 % (ref 0.0–0.2)

## 2020-03-25 LAB — BASIC METABOLIC PANEL
Anion gap: 10 (ref 5–15)
BUN: 16 mg/dL (ref 8–23)
CO2: 21 mmol/L — ABNORMAL LOW (ref 22–32)
Calcium: 8.8 mg/dL — ABNORMAL LOW (ref 8.9–10.3)
Chloride: 111 mmol/L (ref 98–111)
Creatinine, Ser: 1.01 mg/dL — ABNORMAL HIGH (ref 0.44–1.00)
GFR, Estimated: 53 mL/min — ABNORMAL LOW (ref >60)
Glucose, Bld: 82 mg/dL (ref 70–99)
Potassium: 3.7 mmol/L (ref 3.5–5.1)
Sodium: 142 mmol/L (ref 135–145)

## 2020-03-25 LAB — HEPARIN LEVEL (UNFRACTIONATED): Heparin Unfractionated: 1.28 IU/mL — ABNORMAL HIGH (ref 0.30–0.70)

## 2020-03-25 LAB — APTT: aPTT: 96 seconds — ABNORMAL HIGH (ref 24–36)

## 2020-03-25 MED ORDER — SERTRALINE HCL 50 MG PO TABS
150.0000 mg | ORAL_TABLET | Freq: Every day | ORAL | Status: DC
  Administered 2020-03-25 – 2020-03-28 (×4): 150 mg via ORAL
  Filled 2020-03-25 (×4): qty 1

## 2020-03-25 MED ORDER — FUROSEMIDE 20 MG PO TABS
20.0000 mg | ORAL_TABLET | Freq: Every day | ORAL | Status: DC
  Administered 2020-03-25 – 2020-03-28 (×4): 20 mg via ORAL
  Filled 2020-03-25 (×4): qty 1

## 2020-03-25 MED ORDER — ORAL CARE MOUTH RINSE
15.0000 mL | Freq: Two times a day (BID) | OROMUCOSAL | Status: DC
  Administered 2020-03-25 – 2020-03-27 (×5): 15 mL via OROMUCOSAL

## 2020-03-25 MED ORDER — APIXABAN 5 MG PO TABS
5.0000 mg | ORAL_TABLET | Freq: Two times a day (BID) | ORAL | Status: DC
  Administered 2020-03-25 – 2020-03-28 (×7): 5 mg via ORAL
  Filled 2020-03-25 (×7): qty 1

## 2020-03-25 MED ORDER — METOPROLOL SUCCINATE ER 100 MG PO TB24: 100.0000 mg | ORAL_TABLET | Freq: Every day | ORAL | Status: DC

## 2020-03-25 MED ORDER — SODIUM CHLORIDE 0.9 % IV SOLN
1.0000 g | Freq: Three times a day (TID) | INTRAVENOUS | Status: DC
  Filled 2020-03-25: qty 1000

## 2020-03-25 MED ORDER — APIXABAN 5 MG PO TABS: 5.0000 mg | ORAL_TABLET | Freq: Two times a day (BID) | ORAL | Status: DC

## 2020-03-25 MED ORDER — ARIPIPRAZOLE 2 MG PO TABS
2.0000 mg | ORAL_TABLET | Freq: Every day | ORAL | Status: DC
  Administered 2020-03-25 – 2020-03-28 (×4): 2 mg via ORAL
  Filled 2020-03-25 (×4): qty 1

## 2020-03-25 MED ORDER — CHLORHEXIDINE GLUCONATE 0.12 % MT SOLN
15.0000 mL | Freq: Two times a day (BID) | OROMUCOSAL | Status: DC
  Administered 2020-03-25 – 2020-03-27 (×6): 15 mL via OROMUCOSAL
  Filled 2020-03-25 (×7): qty 15

## 2020-03-25 MED ORDER — DIGOXIN 125 MCG PO TABS
0.1250 mg | ORAL_TABLET | Freq: Every day | ORAL | Status: DC
  Administered 2020-03-25 – 2020-03-28 (×4): 0.125 mg via ORAL
  Filled 2020-03-25 (×4): qty 1

## 2020-03-25 MED ORDER — MEGESTROL ACETATE 400 MG/10ML PO SUSP
800.0000 mg | Freq: Every day | ORAL | Status: DC
  Administered 2020-03-25 – 2020-03-28 (×4): 800 mg via ORAL
  Filled 2020-03-25 (×5): qty 20

## 2020-03-25 MED ORDER — METOPROLOL SUCCINATE ER 50 MG PO TB24
50.0000 mg | ORAL_TABLET | Freq: Every day | ORAL | Status: DC
  Administered 2020-03-25 – 2020-03-28 (×4): 50 mg via ORAL
  Filled 2020-03-25 (×4): qty 1

## 2020-03-25 MED ORDER — AMOXICILLIN 500 MG PO CAPS
500.0000 mg | ORAL_CAPSULE | Freq: Three times a day (TID) | ORAL | Status: DC
  Administered 2020-03-25 – 2020-03-28 (×10): 500 mg via ORAL
  Filled 2020-03-25 (×11): qty 1

## 2020-03-25 NOTE — Evaluation (Signed)
Physical Therapy Evaluation Patient Details Name: Veronica Flowers MRN: [id number] DOB: October 25, 1927 Today's Date: 03/25/2020   History of Present Illness  86 year old female, wheelchair-bound, history of A. fib, chronic kidney disease stage III, HTN, UTIs who came in from [facility] with altered mental status and increased lethargy for the past day. admitted with sepsis d/t UTI  Clinical Impression  Patient evaluated by Physical Therapy with no further acute PT needs identified. All education has been completed and the patient has no further questions.  Pt is at baseline for functional mobility, requires 2 person total assist for bed mobility and to pivot to w/c. No further PT indicated at this time   See below for any follow-up Physical Therapy or equipment needs. PT is signing off. Thank you for this referral.     Follow Up Recommendations Other (comment) (return TO ALF, no PT f/u)    Equipment Recommendations  None recommended by PT    Recommendations for Other Services       Precautions / Restrictions Precautions Precautions: Fall Precaution Comments: pt has had multiple falls at her ALF Restrictions Weight Bearing Restrictions: No      Mobility  Bed Mobility Overal bed mobility: Needs Assistance Bed Mobility: Supine to Sit;Sit to Supine     Supine to sit: Total assist Sit to supine: Max assist;+2 for safety/equipment;+2 for physical assistance;Total assist   General bed mobility comments: bed pad utilized to assist pt to sitting. requires +2 assist in both directions for trunk and LEs    Transfers                 General transfer comment: NT, pt prefers to return to supine  Ambulation/Gait                Stairs            Wheelchair Mobility    Modified Rankin (Stroke Patients Only)       Balance Overall balance assessment: Needs assistance Sitting-balance support: No upper extremity supported;Feet supported Sitting balance-Leahy  Scale: Poor Sitting balance - Comments: poor to zero, requires assist to maintain midline. sat EOB x 8 minutes, dtr-in-law feeding pt banana                                     Pertinent Vitals/Pain Pain Assessment: No/denies pain    Home Living Family/patient expects to be discharged to:: Assisted living                 Additional Comments: pt from [facility]    Prior Function Level of Independence: Needs assistance   Gait / Transfers Assistance Needed: per daughter-in-law, pt required two person physical assistance for all transfers and used a w/c for mobility (could not propel herself)  ADL's / Homemaking Assistance Needed: requires assistance from staff        Hand Dominance        Extremity/Trunk Assessment   Upper Extremity Assessment Upper Extremity Assessment: LUE deficits/detail;RUE deficits/detail RUE Deficits / Details: shoulder flexion to ~ 75 degrees, AAROM elbow grossly WFL. LUE Deficits / Details: shoulder flexion to ~ 60 degrees, elbow AAROM grossly WFL, hand positioned in finger extension MCP flexion.    Lower Extremity Assessment Lower Extremity Assessment: RLE deficits/detail RLE Deficits / Details: resistant to imposed movement. maintains bil LEs in knee extension and bil plantar flexion       Communication  Cognition Arousal/Alertness: Awake/alert Behavior During Therapy: WFL for tasks assessed/performed Overall Cognitive Status: Difficult to assess                                 General Comments: pt verbalizes very little. accurate yes/no. states she feels "awful"      General Comments      Exercises     Assessment/Plan    PT Assessment Patent does not need any further PT services  PT Problem List         PT Treatment Interventions      PT Goals (Current goals can be found in the Care Plan section)  Acute Rehab PT Goals Patient Stated Goal: return to [facility] PT Goal Formulation:  All assessment and education complete, DC therapy    Frequency     Barriers to discharge        Co-evaluation               AM-PAC PT "6 Clicks" Mobility  Outcome Measure Help needed turning from your back to your side while in a flat bed without using bedrails?: Total Help needed moving from lying on your back to sitting on the side of a flat bed without using bedrails?: Total Help needed moving to and from a bed to a chair (including a wheelchair)?: Total Help needed standing up from a chair using your arms (e.g., wheelchair or bedside chair)?: Total Help needed to walk in hospital room?: Total Help needed climbing 3-5 steps with a railing? : Total 6 Click Score: 6    End of Session   Activity Tolerance: Patient limited by fatigue Patient left: with call bell/phone within reach;in bed;with family/visitor present Nurse Communication: Mobility status PT Visit Diagnosis: Other abnormalities of gait and mobility (R26.89)    Time: 1517-6160 PT Time Calculation (min) (ACUTE ONLY): 28 min   Charges:   PT Evaluation $PT Eval Low Complexity: 1 Low PT Treatments $Therapeutic Activity: 8-22 mins        Delice Bison, PT  Acute Rehab Dept ([facility]) [phone] Pager [phone]  03/25/2020   [facility] 03/25/2020, 4:16 PM

## 2020-03-25 NOTE — Progress Notes (Signed)
ANTICOAGULATION CONSULT NOTE   Pharmacy Consult for heparin Indication: atrial fibrillation while apixaban on hold  No Known Allergies  Patient Measurements: Height: 5\' 7"  (170.2 cm) Weight: 71.7 kg (158 lb) IBW/kg (Calculated) : 61.6 Heparin Dosing Weight: total body weight  Vital Signs: Temp: 98 F (36.7 C) (11/12 0510) Temp Source: Oral (11/11 1931) BP: 137/58 (11/12 0510) Pulse Rate: 90 (11/12 0510)  Labs: Recent Labs    03/23/20 1240 03/23/20 1240 03/23/20 1745 03/24/20 0603 03/24/20 1800 03/25/20 0348  HGB 10.6*   < >  --  9.0*  --  8.3*  HCT 33.3*  --   --  29.2*  --  27.4*  PLT 409*  --   --  326  --  314  APTT  --    < > 39* 106* 88* 96*  HEPARINUNFRC  --   --  >2.20* >2.20*  --  1.28*  CREATININE 1.44*  --   --  1.10*  --  1.01*   < > = values in this interval not displayed.    Estimated Creatinine Clearance: 36.7 mL/min (A) (by C-G formula based on SCr of 1.01 mg/dL (H)).   Medical History: Past Medical History:  Diagnosis Date  . A-fib (HCC)   . Falls   . Femur fracture, left (HCC)    supracondylar  . Hypertension   . Major depressive disorder   . Nausea   . Seasonal allergies     Medications: Apixaban 5 mg PO BID PTA -Last dose: 11/10 @ 0600  Assessment: Pt is an 31 yoF admitted from ALF with increased lethargy. Pt is prescribed apixaban PTA for atrial fibrillation. Pharmacy consulted to transition anticoagulation to heparin drip.  Baseline labs:  -Hgb: 10.6, Plt: 409 -aPTT: in process -HL: in process  Today, 03/25/20  CBC: Hgb slightly low (stable); Plt wnl  Heparin level > 1.28 (falsely elevated due to effects of Apixaban; however trending down)  APTT = 96 sec (therapeutic) with heparin gtt infusing @ 850 units/hr  No complications of therapy noted  SCr: 1.01, CrCl ~ 36 mL/min  Goal of Therapy:  Heparin level 0.3-0.7 units/ml aPTT 66-102 seconds Monitor platelets by anticoagulation protocol: Yes   Plan:   Continue  heparin infusion @ 850 units/hr.   CBC, HL daily while on heparin infusion (Once HL and aPTT correlate, can monitor using HL only)  Monitor for signs of bleeding  Samentha Perham, 13/12/21, PharmD 03/25/2020,5:43 AM

## 2020-03-25 NOTE — Progress Notes (Signed)
PROGRESS NOTE  Veronica Flowers QQP:[id number] DOB: 1928/04/25 DOA: 03/23/2020 PCP: [facility]   LOS: 2 days   Brief Narrative / Interim history: 86 year old female, wheelchair-bound, history of A. fib, chronic kidney disease stage III, HTN, UTIs who came in from [place] [facility] with altered mental status and increased lethargy for the past day.  Patient has had a decline in her mental status over the last 6 months, at baseline is able to carry a conversation but she was unable to speak prior to bring her to the hospital.  Patient received her COVID-19 booster today prior to admission.  In the ED she was febrile to 101.1, urinalysis was positive for UTI, she was placed on antibiotics and admitted to the hospital.  Subjective / 24h Interval events: Evaluated twice, on initial evaluation she was asleep and unarousable, later on she was alert, looked at me but not verbal  Assessment & Plan: Principal Problem Sepsis due to UTI -Continue to follow-up cultures, continue IV antibiotics, cultures grew Aerococcus and switch to amoxicillin -Her fever may also have been done post Covid booster but could never tell  Active Problems Acute metabolic encephalopathy due to sepsis, history of dementia -CT of the brain unremarkable for acute pathology -Speech therapy along with dysphagia 2diet for now  Rate controlled A. fib -She did well with the diet, resume home regimen with digoxin, Eliquis, metoprolol  Chronic kidney disease stage IIIb -creatinine stable today.  Stop IV fluids now that she is eating, resume home Lasix  Essential hypertension -Blood pressure better, resume home metoprolol  Progressive decline, possible dementia, depression, poor p.o. intake -PT to evaluate -Resume home Zoloft, Megace, Abilify  LE wounds, POA Pressure Injury 03/23/20 Heel Right Stage 2 -  Partial thickness loss of dermis presenting as a shallow open injury with a red, pink wound bed  without slough. callused area with open center and serous drainage (Active)  03/23/20 2130  Location: Heel  Location Orientation: Right  Staging: Stage 2 -  Partial thickness loss of dermis presenting as a shallow open injury with a red, pink wound bed without slough.  Wound Description (Comments): callused area with open center and serous drainage  Present on Admission: Yes     Pressure Injury 03/23/20 Buttocks Right Stage 2 -  Partial thickness loss of dermis presenting as a shallow open injury with a red, pink wound bed without slough. 1 cm denuded area pink base no redness (Active)  03/23/20 2130  Location: Buttocks  Location Orientation: Right  Staging: Stage 2 -  Partial thickness loss of dermis presenting as a shallow open injury with a red, pink wound bed without slough.  Wound Description (Comments): 1 cm denuded area pink base no redness  Present on Admission: Yes     Scheduled Meds:  ARIPiprazole  2 mg Oral Daily   chlorhexidine  15 mL Mouth Rinse BID   digoxin  0.125 mg Oral Daily   furosemide  20 mg Oral Daily   mouth rinse  15 mL Mouth Rinse q12n4p   megestrol  800 mg Oral Daily   metoprolol succinate  50 mg Oral Daily   sertraline  150 mg Oral Daily   sodium chloride flush  3 mL Intravenous Q12H   Continuous Infusions:  ampicillin (OMNIPEN) IV     heparin 850 Units/hr (03/24/20 2117)   lactated ringers 75 mL/hr at 03/24/20 0153   PRN Meds:.  Diet Orders (From admission, onward)    Start  Ordered   03/25/20 1011  DIET DYS 2 Room service appropriate? No; Fluid consistency: Thin  Diet effective now       Question Answer Comment  Room service appropriate? No   Fluid consistency: Thin      03/25/20 1010          DVT prophylaxis:      Code Status: DNR  Family Communication: d/w son at bedside 11/11   Status is: Inpatient  Remains inpatient appropriate because:Inpatient level of care appropriate due to severity of illness   Dispo:  The patient is from: SNF              Anticipated d/c is to: SNF              Anticipated d/c date is: 2 days              Patient currently is not medically stable to d/c.   Consultants:  None   Procedures:  None   Microbiology  Blood cultures, urine cultures-pending  Antimicrobials: Ceftriaxone 11/10 >>    Objective: Vitals:   03/24/20 1325 03/24/20 1931 03/24/20 2358 03/25/20 0510  BP: (!) 121/45 (!) 126/50 (!) 135/59 (!) 137/58  Pulse: 66 90 86 90  Resp: 16 17  19   Temp: 98.5 F (36.9 C) 98.5 F (36.9 C)  98 F (36.7 C)  TempSrc: Oral Oral  Oral  SpO2: 100% 95%  97%  Weight:      Height:        Intake/Output Summary (Last 24 hours) at 03/25/2020 1054 Last data filed at 03/25/2020 13/04/2020 Gross per 24 hour  Intake 1594.68 ml  Output 1850 ml  Net -255.32 ml   Filed Weights   03/23/20 1734 03/23/20 2150  Weight: 70.3 kg 71.7 kg    Examination:  Constitutional: No distress, alert, does not talk much Eyes: No scleral icterus ENMT: mmm Neck: normal, supple Respiratory: Lungs are clear bilaterally, no wheezing or crackles heard Cardiovascular: Regular rate and rhythm, no murmurs, no peripheral edema Abdomen: Soft, nontender, nondistended, bowel sounds positive Musculoskeletal: no clubbing / cyanosis.  Skin: no rash appreciated Neurologic: No focal deficits  Data Reviewed: I have independently reviewed following labs and imaging studies   CBC: Recent Labs  Lab 03/23/20 1240 03/24/20 0603 03/25/20 0348  WBC 14.1* 10.0 9.8  NEUTROABS 12.1*  --   --   HGB 10.6* 9.0* 8.3*  HCT 33.3* 29.2* 27.4*  MCV 94.1 94.8 96.8  PLT 409* 326 314   Basic Metabolic Panel: Recent Labs  Lab 03/23/20 1240 03/24/20 0603 03/25/20 0348  NA 140 138 142  K 4.9 4.2 3.7  CL 104 106 111  CO2 26 22 21*  GLUCOSE 98 95 82  BUN 22 19 16   CREATININE 1.44* 1.10* 1.01*  CALCIUM 9.2 8.7* 8.8*   Liver Function Tests: Recent Labs  Lab 03/23/20 1240  AST 19  ALT 14   ALKPHOS 55  BILITOT 0.6  PROT 8.1  ALBUMIN 3.4*   Coagulation Profile: No results for input(s): INR, PROTIME in the last 168 hours. HbA1C: No results for input(s): HGBA1C in the last 72 hours. CBG: No results for input(s): GLUCAP in the last 168 hours.  Recent Results (from the past 240 hour(s))  Respiratory Panel by RT PCR (Flu A&B, Covid) -     Status: None   Collection Time: 03/23/20 10:25 AM  Result Value Ref Range Status   SARS Coronavirus 2 by RT PCR NEGATIVE NEGATIVE Final  Comment: (NOTE) SARS-CoV-2 target nucleic acids are NOT DETECTED.  The SARS-CoV-2 RNA is generally detectable in upper respiratoy specimens during the acute phase of infection. The lowest concentration of SARS-CoV-2 viral copies this assay can detect is 131 copies/mL. A negative result does not preclude SARS-Cov-2 infection and should not be used as the sole basis for treatment or other patient management decisions. A negative result may occur with  improper specimen collection/handling, submission of specimen other than nasopharyngeal swab, presence of viral mutation(s) within the areas targeted by this assay, and inadequate number of viral copies (<131 copies/mL). A negative result must be combined with clinical observations, patient history, and epidemiological information. The expected result is Negative.  Fact Sheet for Patients:  https://www.moore.com/  Fact Sheet for Healthcare Providers:  https://www.young.biz/  This test is no t yet approved or cleared by the [place] FDA and  has been authorized for detection and/or diagnosis of SARS-CoV-2 by FDA under an Emergency Use Authorization (EUA). This EUA will remain  in effect (meaning this test can be used) for the duration of the COVID-19 declaration under Section 564(b)(1) of the Act, 21 U.S.C. section 360bbb-3(b)(1), unless the authorization is terminated or revoked sooner.     Influenza  A by PCR NEGATIVE NEGATIVE Final   Influenza B by PCR NEGATIVE NEGATIVE Final    Comment: (NOTE) The Xpert Xpress SARS-CoV-2/FLU/RSV assay is intended as an aid in  the diagnosis of influenza from Nasopharyngeal swab specimens and  should not be used as a sole basis for treatment. Nasal washings and  aspirates are unacceptable for Xpert Xpress SARS-CoV-2/FLU/RSV  testing.  Fact Sheet for Patients: https://www.moore.com/  Fact Sheet for Healthcare Providers: https://www.young.biz/  This test is not yet approved or cleared by the [place] FDA and  has been authorized for detection and/or diagnosis of SARS-CoV-2 by  FDA under an Emergency Use Authorization (EUA). This EUA will remain  in effect (meaning this test can be used) for the duration of the  Covid-19 declaration under Section 564(b)(1) of the Act, 21  U.S.C. section 360bbb-3(b)(1), unless the authorization is  terminated or revoked. Performed at [facility], [address] [place], [place] [address]   Blood culture (routine x 2)     Status: None (Preliminary result)   Collection Time: 03/23/20 12:02 PM   Specimen: BLOOD  Result Value Ref Range Status   Specimen Description   Final    BLOOD BLOOD LEFT FOREARM Performed at [facility], [address] [place], [place] [address]    Special Requests   Final    BOTTLES DRAWN AEROBIC AND ANAEROBIC Blood Culture results may not be optimal due to an inadequate volume of blood received in culture bottles Performed at [facility], [address], [place], [place] [address]    Culture   Final    NO GROWTH 2 DAYS Performed at [facility], [address], [place], [place] [address]    Report Status PENDING  Incomplete  Urine culture     Status: Abnormal   Collection Time: 03/23/20 12:40 PM   Specimen: Urine, Clean Catch  Result Value Ref Range Status   Specimen  Description   Final    URINE, CLEAN CATCH Performed at [facility], [address] [place], [place] [address]    Special Requests   Final    NONE Performed at [facility], [address] [place], [place] [address]  Culture (A)  Final    >=100,000 COLONIES/mL AEROCOCCUS SPECIES Standardized susceptibility testing for this organism is not available. Performed at [facility], [address] [place], [place] [address]    Report Status 03/24/2020 FINAL  Final  Blood culture (routine x 2)     Status: Abnormal (Preliminary result)   Collection Time: 03/23/20 12:40 PM   Specimen: BLOOD RIGHT ARM  Result Value Ref Range Status   Specimen Description   Final    BLOOD RIGHT ARM Performed at [facility], [address] [place], [place] [address]    Special Requests   Final    BOTTLES DRAWN AEROBIC AND ANAEROBIC Blood Culture adequate volume Performed at [facility], [address] [place], [place] [address]    Culture  Setup Time   Final    GRAM POSITIVE COCCI IN CLUSTERS AEROBIC BOTTLE ONLY Organism ID to follow CRITICAL RESULT CALLED TO, READ BACK BY AND VERIFIED WITH: Elmer Sow PharmD 11:35 03/24/20 ([email or web address]) Performed at [facility], [address], [place], [place] [address]    Culture STAPHYLOCOCCUS EPIDERMIDIS (A)  Final   Report Status PENDING  Incomplete  Blood Culture ID Panel (Reflexed)     Status: Abnormal   Collection Time: 03/23/20 12:40 PM  Result Value Ref Range Status   Enterococcus faecalis NOT DETECTED NOT DETECTED Final   Enterococcus Faecium NOT DETECTED NOT DETECTED Final   Listeria monocytogenes NOT DETECTED NOT DETECTED Final   Staphylococcus species DETECTED (A) NOT DETECTED Final    Comment: CRITICAL RESULT CALLED TO, READ BACK BY AND VERIFIED WITH: Elmer Sow PharmD 11:35 03/24/20 ([email or web address])    Staphylococcus aureus (BCID) NOT DETECTED NOT DETECTED Final    Staphylococcus epidermidis DETECTED (A) NOT DETECTED Final    Comment: CRITICAL RESULT CALLED TO, READ BACK BY AND VERIFIED WITH: Elmer Sow PharmD 11:35 03/24/20 ([email or web address])    Staphylococcus lugdunensis NOT DETECTED NOT DETECTED Final   Streptococcus species NOT DETECTED NOT DETECTED Final   Streptococcus agalactiae NOT DETECTED NOT DETECTED Final   Streptococcus pneumoniae NOT DETECTED NOT DETECTED Final   Streptococcus pyogenes NOT DETECTED NOT DETECTED Final   A.calcoaceticus-baumannii NOT DETECTED NOT DETECTED Final   Bacteroides fragilis NOT DETECTED NOT DETECTED Final   Enterobacterales NOT DETECTED NOT DETECTED Final   Enterobacter cloacae complex NOT DETECTED NOT DETECTED Final   Escherichia coli NOT DETECTED NOT DETECTED Final   Klebsiella aerogenes NOT DETECTED NOT DETECTED Final   Klebsiella oxytoca NOT DETECTED NOT DETECTED Final   Klebsiella pneumoniae NOT DETECTED NOT DETECTED Final   Proteus species NOT DETECTED NOT DETECTED Final   Salmonella species NOT DETECTED NOT DETECTED Final   Serratia marcescens NOT DETECTED NOT DETECTED Final   Haemophilus influenzae NOT DETECTED NOT DETECTED Final   Neisseria meningitidis NOT DETECTED NOT DETECTED Final   Pseudomonas aeruginosa NOT DETECTED NOT DETECTED Final   Stenotrophomonas maltophilia NOT DETECTED NOT DETECTED Final   Candida albicans NOT DETECTED NOT DETECTED Final   Candida auris NOT DETECTED NOT DETECTED Final   Candida glabrata NOT DETECTED NOT DETECTED Final   Candida krusei NOT DETECTED NOT DETECTED Final   Candida parapsilosis NOT DETECTED NOT DETECTED Final   Candida tropicalis NOT DETECTED NOT DETECTED Final   Cryptococcus neoformans/gattii NOT DETECTED NOT DETECTED Final   Methicillin resistance mecA/C NOT DETECTED NOT DETECTED Final    Comment: Performed at [facility], [address], [place], [place] [address]  Radiology Studies: No results found.  Pamella Pertostin Glenroy Crossen, MD, PhD [facility]  Between 7 am - 7 pm I am available, please contact me via Amion or Securechat  Between 7 pm - 7 am I am not available, please contact night coverage MD/APP via Amion

## 2020-03-25 NOTE — Progress Notes (Signed)
ANTICOAGULATION CONSULT NOTE - Follow Up Consult  Pharmacy Consult for heparin --> Eliquis Indication: hx atrial fibrillation  No Known Allergies  Patient Measurements: Height: 5\' 7"  (170.2 cm) Weight: 71.7 kg (158 lb) IBW/kg (Calculated) : 61.6 Heparin Dosing Weight:   Vital Signs: Temp: 98 F (36.7 C) (11/12 0510) Temp Source: Oral (11/12 0510) BP: 137/58 (11/12 0510) Pulse Rate: 90 (11/12 0510)  Labs: Recent Labs    03/23/20 1240 03/23/20 1240 03/23/20 1745 03/24/20 0603 03/24/20 1800 03/25/20 0348  HGB 10.6*   < >  --  9.0*  --  8.3*  HCT 33.3*  --   --  29.2*  --  27.4*  PLT 409*  --   --  326  --  314  APTT  --    < > 39* 106* 88* 96*  HEPARINUNFRC  --   --  >2.20* >2.20*  --  1.28*  CREATININE 1.44*  --   --  1.10*  --  1.01*   < > = values in this interval not displayed.    Estimated Creatinine Clearance: 36.7 mL/min (A) (by C-G formula based on SCr of 1.01 mg/dL (H)).   Medications:  - on Eliquis 5 mg bid  (last dose taken on 11/10)  Assessment: Patient is an 86 y.o F with hx dementia and afib on Eliquis PTA presented to the ED on 11/10 with AMS.  She was transitioned to heparin drip on admission d/t NPO status for mentation status. Pharmacy is consulted to resume Eliquis on 11/12.  - 11/12 swallow screen: dysphagia 2, meds adm- whole meds with puree   Goal of Therapy:  Heparin level 0.3-0.7 units/ml Monitor platelets by anticoagulation protocol: Yes   Plan:  - d/c heparin drip - start Eliquis 5 mg bid - pharmacy will sign off, but will follow patient peripherally along with you   Kyah Buesing P 03/25/2020,11:18 AM

## 2020-03-25 NOTE — TOC Progression Note (Addendum)
Transition of Care [facility]) - Progression Note    Patient Details  Name: Veronica Flowers MRN: [id number] Date of Birth: 19-Oct-1927  [facility] CM/SW Contact  Ida Rogue, [place] Phone Number: 03/25/2020, 9:54 AM  Clinical Narrative:   Sherron Monday with Lennon Alstrom, RN at [facility] about return of patient.  Updated her re: PT and speech consults for today, and possible return later today.  She will need to review notes, FL2 and d/c summary prior to return.  These are to be FAXED to [phone].  JJ's direct number is [phone].  If patient does not return today, it would be Monday at earliest as they have no RNs on weekends to receive patients back. TOC will continue to follow during the course of hospitalization.  Addendum:  FAXed over PT, speech notes to facility for review.     Expected Discharge Plan: Assisted Living Barriers to Discharge: No Barriers Identified  Expected Discharge Plan and Services Expected Discharge Plan: Assisted Living                                               Social Determinants of Health (SDOH) Interventions    Readmission Risk Interventions No flowsheet data found.

## 2020-03-25 NOTE — Evaluation (Signed)
Clinical/Bedside Swallow Evaluation Patient Details  Name: Veronica Flowers MRN: [id number] Date of Birth: Sep 02, 1927  Today's Date: 03/25/2020 Time: SLP Start Time (ACUTE ONLY): 0920 SLP Stop Time (ACUTE ONLY): 1000 SLP Time Calculation (min) (ACUTE ONLY): 40 min  Past Medical History:  Past Medical History:  Diagnosis Date  . A-fib (HCC)   . Falls   . Femur fracture, left (HCC)    supracondylar  . Hypertension   . Major depressive disorder   . Nausea   . Seasonal allergies    Past Surgical History:  Past Surgical History:  Procedure Laterality Date  . FRACTURE SURGERY     left femur  . ORIF FEMUR FRACTURE Left 09/11/2018   Procedure: OPEN REDUCTION INTERNAL FIXATION (ORIF) DISTAL FEMUR FRACTURE;  Surgeon: Sheral Apley, MD;  Location: [facility] OR;  Service: Orthopedics;  Laterality: Left;   HPI:  86yo female admitted 03/23/20 from ALF with AMS - increased lethargy and weakness. PMH: AFib, CKD3, HTN, UTIs, falls, depression. Family reports mental status decline x6 months. Pt NPO pending BSE.  CXR = no active disease. CTHead = no acute finding.   Assessment / Plan / Recommendation Clinical Impression  Pt presents with functional oropharyngeal swallow based on bedside presentation. CN exam appears WFL. Pt has difficulty following commands consistently, but is able to answer some questions verbally. She indicates she does not have difficulty swallowing. When asked if she is hungry, pt responds with "always". Pt's oral cavity was noted to be significantly dry, with dried secretions on lips. She appears to have adequate dentition, but did not allow full visualization of her oral cavity. Voice quality clear. She was unable to demonstrate volitional cough or swallow. Pt accepted trials of thin liquid, puree, and soft solid. Orally, pr exhibited slight oral residue of solid texture intermittently, and was able to clear oral cavity with liquid wash or dry swallow. No overt s/s aspiration observed  on any consistency given. Several minutes after PO presentation, pt exhibited  belching. Given cognitive impairment, recommend begining with Dys 2 diet and thin liquids. Pt will need 1:1 assistance with feeding. Meds one at a time in puree. Safe swallow precautions posted at [facility]. RN/NT and MD informed of results and recommendations. SLP will follow to assess diet tolerance and readiness to advance solid textures. Conservative recommendation is largely due to cognitive impairment, which may return to pt's baseline as UTI clears.   SLP Visit Diagnosis: Dysphagia, unspecified (R13.10)    Aspiration Risk  Mild aspiration risk    Diet Recommendation Dysphagia 2 (Fine chop);Thin liquid   Liquid Administration via: Cup;Straw Medication Administration: Whole meds with puree Supervision: Full supervision/cueing for compensatory strategies Compensations: Minimize environmental distractions;Slow rate;Small sips/bites Postural Changes: Seated upright at 90 degrees; remain upright 30 minutes after meals   Other  Recommendations Oral Care Recommendations: Oral care BID;Oral care before and after PO;Staff/trained caregiver to provide oral care   Follow up Recommendations 24 hour supervision/assistance;Skilled Nursing facility      Frequency and Duration min 1 x/week  1 week;2 weeks       Prognosis Prognosis for Safe Diet Advancement: Fair Barriers to Reach Goals: Cognitive deficits      Swallow Study   General Date of Onset: 03/23/20 HPI: 86yo female admitted 03/23/20 from ALF with AMS - increased lethargy and weakness. PMH: AFib, CKD3, HTN, UTIs, falls, depression. Family reports mental status decline x6 months. Pt NPO pending BSE.  CXR = no active disease. CTHead = no acute finding. Type of  Study: Bedside Swallow Evaluation Previous Swallow Assessment: none found Diet Prior to this Study: NPO Temperature Spikes Noted: No Respiratory Status: Room air History of Recent Intubation:  No Behavior/Cognition: Alert;Cooperative;Pleasant mood Oral Cavity Assessment: Dry Oral Care Completed by SLP: Yes Oral Cavity - Dentition: Adequate natural dentition Vision: Functional for self-feeding Self-Feeding Abilities: Total assist Patient Positioning: Upright in bed Baseline Vocal Quality: Normal Volitional Cough: Cognitively unable to elicit Volitional Swallow: Unable to elicit    Oral/Motor/Sensory Function Overall Oral Motor/Sensory Function: Within functional limits   Ice Chips Ice chips: Within functional limits Presentation: Spoon   Thin Liquid Thin Liquid: Within functional limits Presentation: Straw    Puree Puree: Within functional limits Presentation: Spoon   Solid     Solid: Impaired Oral Phase Functional Implications: Oral residue (intermittently)     Nyah Shepherd B. Murvin Natal, [facility], CCC-SLP Speech Language Pathologist Office: [phone]  Leigh Aurora 03/25/2020,10:06 AM

## 2020-03-26 LAB — COMPREHENSIVE METABOLIC PANEL
ALT: 13 U/L (ref 0–44)
AST: 18 U/L (ref 15–41)
Albumin: 2.7 g/dL — ABNORMAL LOW (ref 3.5–5.0)
Alkaline Phosphatase: 41 U/L (ref 38–126)
Anion gap: 8 (ref 5–15)
BUN: 12 mg/dL (ref 8–23)
CO2: 23 mmol/L (ref 22–32)
Calcium: 8.6 mg/dL — ABNORMAL LOW (ref 8.9–10.3)
Chloride: 106 mmol/L (ref 98–111)
Creatinine, Ser: 0.91 mg/dL (ref 0.44–1.00)
GFR, Estimated: >60 mL/min (ref >60)
Glucose, Bld: 100 mg/dL — ABNORMAL HIGH (ref 70–99)
Potassium: 3.4 mmol/L — ABNORMAL LOW (ref 3.5–5.1)
Sodium: 137 mmol/L (ref 135–145)
Total Bilirubin: 0.5 mg/dL (ref 0.3–1.2)
Total Protein: 6.2 g/dL — ABNORMAL LOW (ref 6.5–8.1)

## 2020-03-26 LAB — CULTURE, BLOOD (ROUTINE X 2): Special Requests: ADEQUATE

## 2020-03-26 LAB — CBC
HCT: 27.5 % — ABNORMAL LOW (ref 36.0–46.0)
Hemoglobin: 8.7 g/dL — ABNORMAL LOW (ref 12.0–15.0)
MCH: 29.2 pg (ref 26.0–34.0)
MCHC: 31.6 g/dL (ref 30.0–36.0)
MCV: 92.3 fL (ref 80.0–100.0)
Platelets: 315 10*3/uL (ref 150–400)
RBC: 2.98 MIL/uL — ABNORMAL LOW (ref 3.87–5.11)
RDW: 13.4 % (ref 11.5–15.5)
WBC: 8.2 10*3/uL (ref 4.0–10.5)
nRBC: 0 % (ref 0.0–0.2)

## 2020-03-26 MED ORDER — POTASSIUM CHLORIDE 20 MEQ PO PACK
40.0000 meq | PACK | Freq: Once | ORAL | Status: AC
  Administered 2020-03-26: 40 meq via ORAL
  Filled 2020-03-26: qty 2

## 2020-03-26 NOTE — Progress Notes (Signed)
PROGRESS NOTE  Veronica Flowers TMH:[id number] DOB: Jan 28, 1928 DOA: 03/23/2020 PCP: [facility]   LOS: 3 days   Brief Narrative / Interim history: 86 year old female, wheelchair-bound, history of A. fib, chronic kidney disease stage III, HTN, UTIs who came in from [place] nursing home with altered mental status and increased lethargy for the past day.  Patient has had a decline in her mental status over the last 6 months, at baseline is able to carry a conversation but she was unable to speak prior to bring her to the hospital.  Patient received her COVID-19 booster today prior to admission.  In the ED she was febrile to 101.1, urinalysis was positive for UTI, she was placed on antibiotics and admitted to the hospital.  Subjective / 24h Interval events: She is more alert this morning, answering basic questions  Assessment & Plan: Principal Problem Sepsis due to UTI -Continue to follow-up cultures, continue IV antibiotics, cultures grew Aerococcus and switched to amoxicillin -Her fever may also have been done post Covid booster but could never tell  Active Problems Acute metabolic encephalopathy due to sepsis, history of dementia -CT of the brain unremarkable for acute pathology -Speech therapy along with dysphagia 2diet for now  Rate controlled A. fib -She did well with the diet, resume home regimen with digoxin, Eliquis, metoprolol  Chronic kidney disease stage IIIb -Creatinine is stable, back on her home Lasix.  Essential hypertension -Blood pressure control, continue regimen as below  Progressive decline, possible dementia, depression, poor p.o. intake -PT to evaluate -Resume home Zoloft, Megace, Abilify  LE wounds, POA Pressure Injury 03/23/20 Heel Right Stage 2 -  Partial thickness loss of dermis presenting as a shallow open injury with a red, pink wound bed without slough. callused area with open center and serous drainage (Active)  03/23/20 2130   Location: Heel  Location Orientation: Right  Staging: Stage 2 -  Partial thickness loss of dermis presenting as a shallow open injury with a red, pink wound bed without slough.  Wound Description (Comments): callused area with open center and serous drainage  Present on Admission: Yes     Pressure Injury 03/23/20 Buttocks Right Stage 2 -  Partial thickness loss of dermis presenting as a shallow open injury with a red, pink wound bed without slough. 1 cm denuded area pink base no redness (Active)  03/23/20 2130  Location: Buttocks  Location Orientation: Right  Staging: Stage 2 -  Partial thickness loss of dermis presenting as a shallow open injury with a red, pink wound bed without slough.  Wound Description (Comments): 1 cm denuded area pink base no redness  Present on Admission: Yes     Scheduled Meds: . amoxicillin  500 mg Oral Q8H  . apixaban  5 mg Oral BID  . ARIPiprazole  2 mg Oral Daily  . chlorhexidine  15 mL Mouth Rinse BID  . digoxin  0.125 mg Oral Daily  . furosemide  20 mg Oral Daily  . mouth rinse  15 mL Mouth Rinse q12n4p  . megestrol  800 mg Oral Daily  . metoprolol succinate  50 mg Oral Daily  . potassium chloride  40 mEq Oral Once  . sertraline  150 mg Oral Daily  . sodium chloride flush  3 mL Intravenous Q12H   Continuous Infusions:  PRN Meds:.  Diet Orders (From admission, onward)    Start     Ordered   03/25/20 1011  DIET DYS 2 Room service appropriate? No; Fluid consistency: Thin  Diet effective now       Question Answer Comment  Room service appropriate? No   Fluid consistency: Thin      03/25/20 1010          DVT prophylaxis:      Code Status: DNR  Family Communication: d/w son over the phone 11/13   Status is: Inpatient  Remains inpatient appropriate because:Inpatient level of care appropriate due to severity of illness  Dispo: The patient is from: SNF              Anticipated d/c is to: SNF              Anticipated d/c date is: 2  days              Patient currently is not medically stable to d/c.  Consultants:  None   Procedures:  None   Microbiology  Blood cultures, urine cultures-pending  Antimicrobials: Amoxicillin   Objective: Vitals:   03/25/20 1316 03/25/20 2009 03/25/20 2302 03/26/20 0350  BP: 127/65 (!) 137/92 128/66 133/69  Pulse: (!) 101 (!) 101 99 (!) 108  Resp: 16 18  18   Temp: 98.2 F (36.8 C) 98.1 F (36.7 C)  98.2 F (36.8 C)  TempSrc: Oral Oral    SpO2: 98% 93%  96%  Weight:      Height:        Intake/Output Summary (Last 24 hours) at 03/26/2020 1315 Last data filed at 03/26/2020 0357 Gross per 24 hour  Intake 177.82 ml  Output 1000 ml  Net -822.18 ml   Filed Weights   03/23/20 1734 03/23/20 2150  Weight: 70.3 kg 71.7 kg    Examination:  Constitutional: NAD Eyes: no icterus  ENMT: mmm Neck: normal, supple Respiratory: CTA biL, no wheezing, no crackles Cardiovascular: RRR, no MRG, no edema  Abdomen: soft, NT, ND, BS+ Musculoskeletal: no clubbing / cyanosis.  Skin: no rashes Neurologic: non focal   Data Reviewed: I have independently reviewed following labs and imaging studies   CBC: Recent Labs  Lab 03/23/20 1240 03/24/20 0603 03/25/20 0348 03/26/20 0453  WBC 14.1* 10.0 9.8 8.2  NEUTROABS 12.1*  --   --   --   HGB 10.6* 9.0* 8.3* 8.7*  HCT 33.3* 29.2* 27.4* 27.5*  MCV 94.1 94.8 96.8 92.3  PLT 409* 326 314 315   Basic Metabolic Panel: Recent Labs  Lab 03/23/20 1240 03/24/20 0603 03/25/20 0348 03/26/20 0453  NA 140 138 142 137  K 4.9 4.2 3.7 3.4*  CL 104 106 111 106  CO2 26 22 21* 23  GLUCOSE 98 95 82 100*  BUN 22 19 16 12   CREATININE 1.44* 1.10* 1.01* 0.91  CALCIUM 9.2 8.7* 8.8* 8.6*   Liver Function Tests: Recent Labs  Lab 03/23/20 1240 03/26/20 0453  AST 19 18  ALT 14 13  ALKPHOS 55 41  BILITOT 0.6 0.5  PROT 8.1 6.2*  ALBUMIN 3.4* 2.7*   Coagulation Profile: No results for input(s): INR, PROTIME in the last 168  hours. HbA1C: No results for input(s): HGBA1C in the last 72 hours. CBG: No results for input(s): GLUCAP in the last 168 hours.  Recent Results (from the past 240 hour(s))  Respiratory Panel by RT PCR (Flu A&B, Covid) -     Status: None   Collection Time: 03/23/20 10:25 AM  Result Value Ref Range Status   SARS Coronavirus 2 by RT PCR NEGATIVE NEGATIVE Final    Comment: (NOTE) SARS-CoV-2 target nucleic acids  are NOT DETECTED.  The SARS-CoV-2 RNA is generally detectable in upper respiratoy specimens during the acute phase of infection. The lowest concentration of SARS-CoV-2 viral copies this assay can detect is 131 copies/mL. A negative result does not preclude SARS-Cov-2 infection and should not be used as the sole basis for treatment or other patient management decisions. A negative result may occur with  improper specimen collection/handling, submission of specimen other than nasopharyngeal swab, presence of viral mutation(s) within the areas targeted by this assay, and inadequate number of viral copies (<131 copies/mL). A negative result must be combined with clinical observations, patient history, and epidemiological information. The expected result is Negative.  Fact Sheet for Patients:  https://www.moore.com/  Fact Sheet for Healthcare Providers:  https://www.young.biz/  This test is no t yet approved or cleared by the [place] FDA and  has been authorized for detection and/or diagnosis of SARS-CoV-2 by FDA under an Emergency Use Authorization (EUA). This EUA will remain  in effect (meaning this test can be used) for the duration of the COVID-19 declaration under Section 564(b)(1) of the Act, 21 U.S.C. section 360bbb-3(b)(1), unless the authorization is terminated or revoked sooner.     Influenza A by PCR NEGATIVE NEGATIVE Final   Influenza B by PCR NEGATIVE NEGATIVE Final    Comment: (NOTE) The Xpert Xpress  SARS-CoV-2/FLU/RSV assay is intended as an aid in  the diagnosis of influenza from Nasopharyngeal swab specimens and  should not be used as a sole basis for treatment. Nasal washings and  aspirates are unacceptable for Xpert Xpress SARS-CoV-2/FLU/RSV  testing.  Fact Sheet for Patients: https://www.moore.com/  Fact Sheet for Healthcare Providers: https://www.young.biz/  This test is not yet approved or cleared by the [place] FDA and  has been authorized for detection and/or diagnosis of SARS-CoV-2 by  FDA under an Emergency Use Authorization (EUA). This EUA will remain  in effect (meaning this test can be used) for the duration of the  Covid-19 declaration under Section 564(b)(1) of the Act, 21  U.S.C. section 360bbb-3(b)(1), unless the authorization is  terminated or revoked. Performed at [facility], [address] [place] [address] [place], [place] [address]   Blood culture (routine x 2)     Status: None (Preliminary result)   Collection Time: 03/23/20 12:02 PM   Specimen: BLOOD  Result Value Ref Range Status   Specimen Description   Final    BLOOD BLOOD LEFT FOREARM Performed at [facility], [address], [place], [place] [address]    Special Requests   Final    BOTTLES DRAWN AEROBIC AND ANAEROBIC Blood Culture results may not be optimal due to an inadequate volume of blood received in culture bottles Performed at [facility], [address] [place], [place] [address]    Culture   Final    NO GROWTH 3 DAYS Performed at [facility], [address] [place], [place] [address]    Report Status PENDING  Incomplete  Urine culture     Status: Abnormal   Collection Time: 03/23/20 12:40 PM   Specimen: Urine, Clean Catch  Result Value Ref Range Status   Specimen Description   Final    URINE, CLEAN CATCH Performed at [facility], [address], [place],  [place] [address]    Special Requests   Final    NONE Performed at [facility], [address] [place], [place] [address]    Culture (A)  Final    >=  100,000 COLONIES/mL AEROCOCCUS SPECIES Standardized susceptibility testing for this organism is not available. Performed at [facility], [address] [place], [place] [address]    Report Status 03/24/2020 FINAL  Final  Blood culture (routine x 2)     Status: Abnormal   Collection Time: 03/23/20 12:40 PM   Specimen: BLOOD RIGHT ARM  Result Value Ref Range Status   Specimen Description   Final    BLOOD RIGHT ARM Performed at [facility], [address] [place], [place] [address]    Special Requests   Final    BOTTLES DRAWN AEROBIC AND ANAEROBIC Blood Culture adequate volume Performed at [facility], [address] [place], [place] [address]    Culture  Setup Time   Final    GRAM POSITIVE COCCI IN CLUSTERS AEROBIC BOTTLE ONLY Organism ID to follow CRITICAL RESULT CALLED TO, READ BACK BY AND VERIFIED WITH: Elmer SowM. Michael PharmD 11:35 03/24/20 ([email or web address])    Culture (A)  Final    STAPHYLOCOCCUS EPIDERMIDIS THE SIGNIFICANCE OF ISOLATING THIS ORGANISM FROM A SINGLE SET OF BLOOD CULTURES WHEN MULTIPLE SETS ARE DRAWN IS UNCERTAIN. PLEASE NOTIFY THE MICROBIOLOGY DEPARTMENT WITHIN ONE WEEK IF SPECIATION AND SENSITIVITIES ARE REQUIRED. Performed at [facility], [address] [place], [place] [address]    Report Status 03/26/2020 FINAL  Final  Blood Culture ID Panel (Reflexed)     Status: Abnormal   Collection Time: 03/23/20 12:40 PM  Result Value Ref Range Status   Enterococcus faecalis NOT DETECTED NOT DETECTED Final   Enterococcus Faecium NOT DETECTED NOT DETECTED Final   Listeria monocytogenes NOT DETECTED NOT DETECTED Final   Staphylococcus species DETECTED (A) NOT DETECTED Final    Comment: CRITICAL RESULT CALLED TO, READ BACK BY AND VERIFIED WITH: Elmer SowM. Michael PharmD  11:35 03/24/20 ([email or web address])    Staphylococcus aureus (BCID) NOT DETECTED NOT DETECTED Final   Staphylococcus epidermidis DETECTED (A) NOT DETECTED Final    Comment: CRITICAL RESULT CALLED TO, READ BACK BY AND VERIFIED WITH: Elmer SowM. Michael PharmD 11:35 03/24/20 ([email or web address])    Staphylococcus lugdunensis NOT DETECTED NOT DETECTED Final   Streptococcus species NOT DETECTED NOT DETECTED Final   Streptococcus agalactiae NOT DETECTED NOT DETECTED Final   Streptococcus pneumoniae NOT DETECTED NOT DETECTED Final   Streptococcus pyogenes NOT DETECTED NOT DETECTED Final   A.calcoaceticus-baumannii NOT DETECTED NOT DETECTED Final   Bacteroides fragilis NOT DETECTED NOT DETECTED Final   Enterobacterales NOT DETECTED NOT DETECTED Final   Enterobacter cloacae complex NOT DETECTED NOT DETECTED Final   Escherichia coli NOT DETECTED NOT DETECTED Final   Klebsiella aerogenes NOT DETECTED NOT DETECTED Final   Klebsiella oxytoca NOT DETECTED NOT DETECTED Final   Klebsiella pneumoniae NOT DETECTED NOT DETECTED Final   Proteus species NOT DETECTED NOT DETECTED Final   Salmonella species NOT DETECTED NOT DETECTED Final   Serratia marcescens NOT DETECTED NOT DETECTED Final   Haemophilus influenzae NOT DETECTED NOT DETECTED Final   Neisseria meningitidis NOT DETECTED NOT DETECTED Final   Pseudomonas aeruginosa NOT DETECTED NOT DETECTED Final   Stenotrophomonas maltophilia NOT DETECTED NOT DETECTED Final   Candida albicans NOT DETECTED NOT DETECTED Final   Candida auris NOT DETECTED NOT DETECTED Final   Candida glabrata NOT DETECTED NOT DETECTED Final   Candida krusei NOT DETECTED NOT DETECTED Final   Candida parapsilosis NOT DETECTED NOT DETECTED Final   Candida tropicalis NOT DETECTED NOT DETECTED Final   Cryptococcus neoformans/gattii NOT DETECTED NOT DETECTED Final  Methicillin resistance mecA/C NOT DETECTED NOT DETECTED Final    Comment: Performed at [facility], [address], [place], [place]  [address]     Radiology Studies: No results found.  Pamella Pert, MD, PhD [facility]  Between 7 am - 7 pm I am available, please contact me via Amion or Securechat  Between 7 pm - 7 am I am not available, please contact night coverage MD/APP via Amion

## 2020-03-27 LAB — RESPIRATORY PANEL BY RT PCR (FLU A&B, COVID)
Influenza A by PCR: NEGATIVE
Influenza B by PCR: NEGATIVE
SARS Coronavirus 2 by RT PCR: NEGATIVE

## 2020-03-27 LAB — BASIC METABOLIC PANEL
Anion gap: 8 (ref 5–15)
BUN: 11 mg/dL (ref 8–23)
CO2: 24 mmol/L (ref 22–32)
Calcium: 8.6 mg/dL — ABNORMAL LOW (ref 8.9–10.3)
Chloride: 104 mmol/L (ref 98–111)
Creatinine, Ser: 1.03 mg/dL — ABNORMAL HIGH (ref 0.44–1.00)
GFR, Estimated: 52 mL/min — ABNORMAL LOW (ref >60)
Glucose, Bld: 98 mg/dL (ref 70–99)
Potassium: 4 mmol/L (ref 3.5–5.1)
Sodium: 136 mmol/L (ref 135–145)

## 2020-03-27 NOTE — Progress Notes (Signed)
PROGRESS NOTE  Veronica Flowers EVO:[id number] DOB: May 11, 1928 DOA: 03/23/2020 PCP: [facility]   LOS: 4 days   Brief Narrative / Interim history: 86 year old female, wheelchair-bound, history of A. fib, chronic kidney disease stage III, HTN, UTIs who came in from [place] nursing home with altered mental status and increased lethargy for the past day.  Patient has had a decline in her mental status over the last 6 months, at baseline is able to carry a conversation but she was unable to speak prior to bring her to the hospital.  Patient received her COVID-19 booster today prior to admission.  In the ED she was febrile to 101.1, urinalysis was positive for UTI, she was placed on antibiotics and admitted to the hospital.  Subjective / 24h Interval events: Appears returned to baseline, alert, answering questions, somewhat oriented  Assessment & Plan: Principal Problem Sepsis due to UTI -Continue to follow-up cultures, continue IV antibiotics, cultures grew Aerococcus and switched to amoxicillin -Her fever may also have been done post Covid booster but could never tell  Active Problems Acute metabolic encephalopathy due to sepsis, history of dementia -CT of the brain unremarkable for acute pathology -Speech therapy along with dysphagia 2diet for now  Rate controlled A. fib -She did well with the diet, resume home regimen with digoxin, Eliquis, metoprolol  Chronic kidney disease stage IIIb -Creatinine remains stable today  Essential hypertension -Blood pressure control, continue regimen as below  Progressive decline, possible dementia, depression, poor p.o. intake -Continue working with PT -Resume home Zoloft, Megace, Abilify  LE wounds, POA Pressure Injury 03/23/20 Heel Right Stage 2 -  Partial thickness loss of dermis presenting as a shallow open injury with a red, pink wound bed without slough. callused area with open center and serous drainage (Active)  03/23/20  2130  Location: Heel  Location Orientation: Right  Staging: Stage 2 -  Partial thickness loss of dermis presenting as a shallow open injury with a red, pink wound bed without slough.  Wound Description (Comments): callused area with open center and serous drainage  Present on Admission: Yes     Pressure Injury 03/23/20 Buttocks Right Stage 2 -  Partial thickness loss of dermis presenting as a shallow open injury with a red, pink wound bed without slough. 1 cm denuded area pink base no redness (Active)  03/23/20 2130  Location: Buttocks  Location Orientation: Right  Staging: Stage 2 -  Partial thickness loss of dermis presenting as a shallow open injury with a red, pink wound bed without slough.  Wound Description (Comments): 1 cm denuded area pink base no redness  Present on Admission: Yes     Scheduled Meds: . amoxicillin  500 mg Oral Q8H  . apixaban  5 mg Oral BID  . ARIPiprazole  2 mg Oral Daily  . chlorhexidine  15 mL Mouth Rinse BID  . digoxin  0.125 mg Oral Daily  . furosemide  20 mg Oral Daily  . mouth rinse  15 mL Mouth Rinse q12n4p  . megestrol  800 mg Oral Daily  . metoprolol succinate  50 mg Oral Daily  . sertraline  150 mg Oral Daily  . sodium chloride flush  3 mL Intravenous Q12H   Continuous Infusions:  PRN Meds:.  Diet Orders (From admission, onward)    Start     Ordered   03/25/20 1011  DIET DYS 2 Room service appropriate? No; Fluid consistency: Thin  Diet effective now       Question Answer  Comment  Room service appropriate? No   Fluid consistency: Thin      03/25/20 1010          DVT prophylaxis:      Code Status: DNR  Family Communication: d/w son over the phone 11/13   Status is: Inpatient  Remains inpatient appropriate because:Inpatient level of care appropriate due to severity of illness  Dispo: The patient is from: SNF              Anticipated d/c is to: SNF              Anticipated d/c date is: 1 day              Patient currently  is not medically stable to d/c.  Consultants:  None   Procedures:  None   Microbiology  Blood cultures 1/4 bottles staph epi, likely contaminant Urine cultures-Aerococcus  Antimicrobials: Amoxicillin   Objective: Vitals:   03/26/20 1413 03/26/20 2039 03/27/20 0605 03/27/20 0908  BP: (!) 110/57 (!) 110/35 (!) 114/57   Pulse: 82 82 (!) 102 98  Resp: Temp: 98 F (36.7 C) 98.5 F (36.9 C) 98.2 F (36.8 C)   TempSrc: Oral Oral Oral   SpO2: 98% 93% 97%   Weight:      Height:        Intake/Output Summary (Last 24 hours) at 03/27/2020 1055 Last data filed at 03/27/2020 0950 Gross per 24 hour  Intake 240 ml  Output 700 ml  Net -460 ml   Filed Weights   03/23/20 1734 03/23/20 2150  Weight: 70.3 kg 71.7 kg    Examination:  Constitutional: No distress Eyes: No scleral icterus ENMT: Moist mucous membranes Neck: normal, supple Respiratory: Lungs are clear, no wheezing Cardiovascular: Heart is regular, no edema Abdomen: Soft, nontender, nondistended, bowel sounds positive Musculoskeletal: no clubbing / cyanosis.  Skin: No rashes appreciated Neurologic: No focal deficits  Data Reviewed: I have independently reviewed following labs and imaging studies   CBC: Recent Labs  Lab 03/23/20 1240 03/24/20 0603 03/25/20 0348 03/26/20 0453  WBC 14.1* 10.0 9.8 8.2  NEUTROABS 12.1*  --   --   --   HGB 10.6* 9.0* 8.3* 8.7*  HCT 33.3* 29.2* 27.4* 27.5*  MCV 94.1 94.8 96.8 92.3  PLT 409* 326 314 315   Basic Metabolic Panel: Recent Labs  Lab 03/23/20 1240 03/24/20 0603 03/25/20 0348 03/26/20 0453 03/27/20 0405  NA 140 138 142 137 136  K 4.9 4.2 3.7 3.4* 4.0  CL 104 106 111 106 104  CO2 26 22 21* 23 24  GLUCOSE 98 95 82 100* 98  BUN CREATININE 1.44* 1.10* 1.01* 0.91 1.03*  CALCIUM 9.2 8.7* 8.8* 8.6* 8.6*   Liver Function Tests: Recent Labs  Lab 03/23/20 1240 03/26/20 0453  AST 19 18  ALT 14 13  ALKPHOS 55 41  BILITOT 0.6 0.5   PROT 8.1 6.2*  ALBUMIN 3.4* 2.7*   Coagulation Profile: No results for input(s): INR, PROTIME in the last 168 hours. HbA1C: No results for input(s): HGBA1C in the last 72 hours. CBG: No results for input(s): GLUCAP in the last 168 hours.  Recent Results (from the past 240 hour(s))  Respiratory Panel by RT PCR (Flu A&B, Covid) -     Status: None   Collection Time: 03/23/20 10:25 AM  Result Value Ref Range Status   SARS Coronavirus 2 by RT PCR NEGATIVE NEGATIVE Final  Comment: (NOTE) SARS-CoV-2 target nucleic acids are NOT DETECTED.  The SARS-CoV-2 RNA is generally detectable in upper respiratoy specimens during the acute phase of infection. The lowest concentration of SARS-CoV-2 viral copies this assay can detect is 131 copies/mL. A negative result does not preclude SARS-Cov-2 infection and should not be used as the sole basis for treatment or other patient management decisions. A negative result may occur with  improper specimen collection/handling, submission of specimen other than nasopharyngeal swab, presence of viral mutation(s) within the areas targeted by this assay, and inadequate number of viral copies (<131 copies/mL). A negative result must be combined with clinical observations, patient history, and epidemiological information. The expected result is Negative.  Fact Sheet for Patients:  https://www.moore.com/  Fact Sheet for Healthcare Providers:  https://www.young.biz/  This test is no t yet approved or cleared by the [place] FDA and  has been authorized for detection and/or diagnosis of SARS-CoV-2 by FDA under an Emergency Use Authorization (EUA). This EUA will remain  in effect (meaning this test can be used) for the duration of the COVID-19 declaration under Section 564(b)(1) of the Act, 21 U.S.C. section 360bbb-3(b)(1), unless the authorization is terminated or revoked sooner.     Influenza A by PCR NEGATIVE  NEGATIVE Final   Influenza B by PCR NEGATIVE NEGATIVE Final    Comment: (NOTE) The Xpert Xpress SARS-CoV-2/FLU/RSV assay is intended as an aid in  the diagnosis of influenza from Nasopharyngeal swab specimens and  should not be used as a sole basis for treatment. Nasal washings and  aspirates are unacceptable for Xpert Xpress SARS-CoV-2/FLU/RSV  testing.  Fact Sheet for Patients: https://www.moore.com/  Fact Sheet for Healthcare Providers: https://www.young.biz/  This test is not yet approved or cleared by the [place] FDA and  has been authorized for detection and/or diagnosis of SARS-CoV-2 by  FDA under an Emergency Use Authorization (EUA). This EUA will remain  in effect (meaning this test can be used) for the duration of the  Covid-19 declaration under Section 564(b)(1) of the Act, 21  U.S.C. section 360bbb-3(b)(1), unless the authorization is  terminated or revoked. Performed at [facility], [address] [place], [place] [address]   Blood culture (routine x 2)     Status: None (Preliminary result)   Collection Time: 03/23/20 12:02 PM   Specimen: BLOOD  Result Value Ref Range Status   Specimen Description   Final    BLOOD BLOOD LEFT FOREARM Performed at [facility], [address] [place], [place] [address]    Special Requests   Final    BOTTLES DRAWN AEROBIC AND ANAEROBIC Blood Culture results may not be optimal due to an inadequate volume of blood received in culture bottles Performed at [facility], [address] [place], [place] [address]    Culture   Final    NO GROWTH 4 DAYS Performed at [facility], [address], [place], [place] [address]    Report Status PENDING  Incomplete  Urine culture     Status: Abnormal   Collection Time: 03/23/20 12:40 PM   Specimen: Urine, Clean Catch  Result Value Ref Range Status   Specimen Description   Final     URINE, CLEAN CATCH Performed at [facility], [address] [place], [place] [address]    Special Requests   Final    NONE Performed at [facility], [address], [place], [place] [address]  Culture (A)  Final    >=100,000 COLONIES/mL AEROCOCCUS SPECIES Standardized susceptibility testing for this organism is not available. Performed at [facility], [address] [place], [place] [address]    Report Status 03/24/2020 FINAL  Final  Blood culture (routine x 2)     Status: Abnormal   Collection Time: 03/23/20 12:40 PM   Specimen: BLOOD RIGHT ARM  Result Value Ref Range Status   Specimen Description   Final    BLOOD RIGHT ARM Performed at [facility], [address] [place], [place] [address]    Special Requests   Final    BOTTLES DRAWN AEROBIC AND ANAEROBIC Blood Culture adequate volume Performed at [facility], [address] [place], [place] [address]    Culture  Setup Time   Final    GRAM POSITIVE COCCI IN CLUSTERS AEROBIC BOTTLE ONLY Organism ID to follow CRITICAL RESULT CALLED TO, READ BACK BY AND VERIFIED WITH: Elmer SowM. Michael PharmD 11:35 03/24/20 ([email or web address])    Culture (A)  Final    STAPHYLOCOCCUS EPIDERMIDIS THE SIGNIFICANCE OF ISOLATING THIS ORGANISM FROM A SINGLE SET OF BLOOD CULTURES WHEN MULTIPLE SETS ARE DRAWN IS UNCERTAIN. PLEASE NOTIFY THE MICROBIOLOGY DEPARTMENT WITHIN ONE WEEK IF SPECIATION AND SENSITIVITIES ARE REQUIRED. Performed at [facility], [address] [place], [place] [address]    Report Status 03/26/2020 FINAL  Final  Blood Culture ID Panel (Reflexed)     Status: Abnormal   Collection Time: 03/23/20 12:40 PM  Result Value Ref Range Status   Enterococcus faecalis NOT DETECTED NOT DETECTED Final   Enterococcus Faecium NOT DETECTED NOT DETECTED Final   Listeria monocytogenes NOT DETECTED NOT DETECTED Final   Staphylococcus species DETECTED (A) NOT  DETECTED Final    Comment: CRITICAL RESULT CALLED TO, READ BACK BY AND VERIFIED WITH: Elmer SowM. Michael PharmD 11:35 03/24/20 ([email or web address])    Staphylococcus aureus (BCID) NOT DETECTED NOT DETECTED Final   Staphylococcus epidermidis DETECTED (A) NOT DETECTED Final    Comment: CRITICAL RESULT CALLED TO, READ BACK BY AND VERIFIED WITH: Elmer SowM. Michael PharmD 11:35 03/24/20 ([email or web address])    Staphylococcus lugdunensis NOT DETECTED NOT DETECTED Final   Streptococcus species NOT DETECTED NOT DETECTED Final   Streptococcus agalactiae NOT DETECTED NOT DETECTED Final   Streptococcus pneumoniae NOT DETECTED NOT DETECTED Final   Streptococcus pyogenes NOT DETECTED NOT DETECTED Final   A.calcoaceticus-baumannii NOT DETECTED NOT DETECTED Final   Bacteroides fragilis NOT DETECTED NOT DETECTED Final   Enterobacterales NOT DETECTED NOT DETECTED Final   Enterobacter cloacae complex NOT DETECTED NOT DETECTED Final   Escherichia coli NOT DETECTED NOT DETECTED Final   Klebsiella aerogenes NOT DETECTED NOT DETECTED Final   Klebsiella oxytoca NOT DETECTED NOT DETECTED Final   Klebsiella pneumoniae NOT DETECTED NOT DETECTED Final   Proteus species NOT DETECTED NOT DETECTED Final   Salmonella species NOT DETECTED NOT DETECTED Final   Serratia marcescens NOT DETECTED NOT DETECTED Final   Haemophilus influenzae NOT DETECTED NOT DETECTED Final   Neisseria meningitidis NOT DETECTED NOT DETECTED Final   Pseudomonas aeruginosa NOT DETECTED NOT DETECTED Final   Stenotrophomonas maltophilia NOT DETECTED NOT DETECTED Final   Candida albicans NOT DETECTED NOT DETECTED Final   Candida auris NOT DETECTED NOT DETECTED Final   Candida glabrata NOT DETECTED NOT DETECTED Final   Candida krusei NOT DETECTED NOT DETECTED Final   Candida parapsilosis NOT DETECTED NOT DETECTED Final   Candida tropicalis NOT DETECTED NOT DETECTED Final   Cryptococcus  neoformans/gattii NOT DETECTED NOT DETECTED Final   Methicillin resistance mecA/C NOT DETECTED  NOT DETECTED Final    Comment: Performed at [facility], [address] [place], [place] [address]     Radiology Studies: No results found.  Pamella Pert, MD, PhD [facility]  Between 7 am - 7 pm I am available, please contact me via Amion or Securechat  Between 7 pm - 7 am I am not available, please contact night coverage MD/APP via Amion

## 2020-03-28 DIAGNOSIS — N1831 Chronic kidney disease, stage 3a: Secondary | ICD-10-CM

## 2020-03-28 LAB — CULTURE, BLOOD (ROUTINE X 2): Culture: NO GROWTH

## 2020-03-28 MED ORDER — HYDROCODONE-ACETAMINOPHEN 5-325 MG PO TABS: 1.0000 | ORAL_TABLET | Freq: Four times a day (QID) | ORAL | 0 refills | Status: AC | PRN

## 2020-03-28 MED ORDER — AMOXICILLIN 500 MG PO CAPS: 500.0000 mg | ORAL_CAPSULE | Freq: Three times a day (TID) | ORAL | 0 refills | Status: AC

## 2020-03-28 MED ORDER — TRAMADOL HCL 50 MG PO TABS: 50.0000 mg | ORAL_TABLET | Freq: Four times a day (QID) | ORAL | 0 refills | Status: AC | PRN

## 2020-03-28 NOTE — Discharge Summary (Signed)
Physician Discharge Summary  Veronica GarnetMary Ballow ZOX:[id number] DOB: [birth year] DOA: 03/23/2020  PCP: Almetta LovelyHousecalls, [facility]  Admit date: 03/23/2020 Discharge date: 03/28/2020  Admitted From: ALF Disposition:  ALF  Recommendations for Outpatient Follow-up:  1. Follow up with PCP in 1-2 weeks  Home Health: None Equipment/Devices: None  Discharge Condition: Stable CODE STATUS: DNR Diet recommendation: Dysphagia two with thin fluid   HPI: Per admitting MD, This is an 86 year old female who is wheelchair-bound with past medical history of atrial fibrillation, CKD 3, hypertension, UTIs who was brought in from [facility] with altered mental status and increased lethargy for the past day.  According to the daughter-in-law, patient has been having a decline in her mental status over the past 6 months and at baseline is able to have a conversation but noted that this morning when she and her husband went to visit, the patient was unable to speak at all and seemed pale.  She was told that the patient had low blood pressure at the facility however there is no record of her BP at this time.  She was transferred via EMS to the hospital for further work-up.  Of note the patient also received her COVID-19 booster yesterday.  Hospital Course / Discharge diagnoses: Principal Problem Sepsis due to UTI -patient was admitted to the hospital with sepsis due to urinary tract infection.  She was placed on broad-spectrum intravenous antibiotics, and her sepsis physiology has resolved.  Urine culture eventually speciated Aerococcus and she was narrowed to amoxicillin.  Please continue amoxicillin for three additional days so that she complete a 7-day course.  Active Problems Acute metabolic encephalopathy due to sepsis, history of dementia-CT of the brain unremarkable for acute pathology, with antibiotics and treatment of the UTI her mental status has returned to baseline as confirmed by  family. Rate controlled A. Fib -continue home medications on discharge Chronic kidney disease stage IIIb -her creatinine has remained stable Essential hypertension -resume home medications Progressive decline, possible dementia, depression, poor p.o. intake -Resume home Zoloft, Megace, Abilify  Pressure Injury 03/23/20 Heel Right Stage 2 -  Partial thickness loss of dermis presenting as a shallow open injury with a red, pink wound bed without slough. callused area with open center and serous drainage (Active)  03/23/20 2130  Location: Heel  Location Orientation: Right  Staging: Stage 2 -  Partial thickness loss of dermis presenting as a shallow open injury with a red, pink wound bed without slough.  Wound Description (Comments): callused area with open center and serous drainage  Present on Admission: Yes     Pressure Injury 03/23/20 Buttocks Right Stage 2 -  Partial thickness loss of dermis presenting as a shallow open injury with a red, pink wound bed without slough. 1 cm denuded area pink base no redness (Active)  03/23/20 2130  Location: Buttocks  Location Orientation: Right  Staging: Stage 2 -  Partial thickness loss of dermis presenting as a shallow open injury with a red, pink wound bed without slough.  Wound Description (Comments): 1 cm denuded area pink base no redness  Present on Admission: Yes   Discharge Instructions   Allergies as of 03/28/2020   No Known Allergies     Medication List    STOP taking these medications   cephALEXin 250 MG capsule Commonly known as: KEFLEX     TAKE these medications   acetaminophen 500 MG tablet Commonly known as: TYLENOL Take 500 mg by mouth every 6 (six) hours as needed  for moderate pain or headache.   amoxicillin 500 MG capsule Commonly known as: AMOXIL Take 1 capsule (500 mg total) by mouth every 8 (eight) hours for 3 days.   ARIPiprazole 2 MG tablet Commonly known as: ABILIFY Take 2 mg by mouth daily.   ascorbic acid  500 MG tablet Commonly known as: VITAMIN C Take 500 mg by mouth daily.   CENTRUM PO Take 1 tablet by mouth daily.   cholecalciferol 25 MCG (1000 UNIT) tablet Commonly known as: VITAMIN D3 Take 1,000 Units by mouth daily.   digoxin 0.125 MG tablet Commonly known as: LANOXIN Take 0.125 mg by mouth daily.   Eliquis 5 MG Tabs tablet Generic drug: apixaban Take 5 mg by mouth 2 (two) times daily.   feeding supplement Liqd Take 237 mLs by mouth 2 (two) times daily between meals.   fluticasone 50 MCG/ACT nasal spray Commonly known as: FLONASE Place 1 spray into both nostrils 2 (two) times a day.   furosemide 20 MG tablet Commonly known as: LASIX Take 20 mg by mouth daily.   HYDROcodone-acetaminophen 5-325 MG tablet Commonly known as: NORCO/VICODIN Take 1 tablet by mouth every 6 (six) hours as needed for moderate pain or severe pain.   loratadine 10 MG tablet Commonly known as: CLARITIN Take 10 mg by mouth at bedtime.   megestrol 40 MG/ML suspension Commonly known as: MEGACE Take 20 mLs by mouth daily.   Melatonin 3 MG Caps Take 3 mg by mouth at bedtime.   metoprolol succinate 100 MG 24 hr tablet Commonly known as: TOPROL-XL Take 100 mg by mouth daily.   polyethylene glycol 17 g packet Commonly known as: MIRALAX / GLYCOLAX Take 17 g by mouth daily.   potassium chloride 10 MEQ CR capsule Commonly known as: MICRO-K Take 10 mEq by mouth daily.   senna 8.6 MG Tabs tablet Commonly known as: SENOKOT Take 1 tablet by mouth at bedtime.   sertraline 100 MG tablet Commonly known as: ZOLOFT Take 150 mg by mouth daily.   traMADol 50 MG tablet Commonly known as: ULTRAM Take 1 tablet (50 mg total) by mouth every 6 (six) hours as needed for moderate pain.       Follow-up Information    [facility] Follow up.   Specialty: Assisted Living Facility Contact information: [address] [place] [address] [phone]                Consultations:  None   Procedures/Studies:  CT Head Wo Contrast  Result Date: 03/23/2020 CLINICAL DATA:  Altered mental status since yesterday. EXAM: CT HEAD WITHOUT CONTRAST TECHNIQUE: Contiguous axial images were obtained from the base of the skull through the vertex without intravenous contrast. COMPARISON:  08/13/2019 FINDINGS: Brain: Age related atrophy. No focal abnormality affects the brainstem or cerebellum. Cerebral hemispheres show advanced confluent chronic small vessel ischemic changes of the white matter. No mass, hemorrhage, hydrocephalus or extra-axial collection. Vascular: No abnormal vascular finding. Skull: Negative Sinuses/Orbits: Clear/normal Other: None IMPRESSION: No acute finding by CT. Age related atrophy. Advanced chronic small-vessel ischemic changes of the cerebral hemispheric white matter. Electronically Signed   By: Paulina Fusi M.D.   On: 03/23/2020 11:24   DG Chest Portable 1 View  Result Date: 03/23/2020 CLINICAL DATA:  Weakness EXAM: PORTABLE CHEST 1 VIEW COMPARISON:  05/08/2019 FINDINGS: Heart size remains mildly enlarged. Atherosclerotic calcification of the aortic knob. No focal airspace consolidation, pleural effusion, or pneumothorax. IMPRESSION: No active disease. Electronically Signed  By: Duanne Guess D.O.   On: 03/23/2020 11:57      Subjective: - no chest pain, shortness of breath, no abdominal pain, nausea or vomiting.   Discharge Exam: BP (!) 115/45   Pulse 77   Temp 98.2 F (36.8 C) (Oral)   Resp 18   Ht  (1.702 m)   Wt 71.7 kg   SpO2 95%   BMI 24.75 kg/m   General: Pt is alert, awake, not in acute distress Cardiovascular: appears regular, no edema Respiratory: CTA bilaterally, no wheezing, no rhonchi Abdominal: Soft, NT, ND, bowel sounds + Extremities: no edema, no cyanosis  The results of significant diagnostics from this hospitalization (including imaging, microbiology, ancillary and laboratory) are listed below  for reference.     Microbiology: Recent Results (from the past 240 hour(s))  Respiratory Panel by RT PCR (Flu A&B, Covid) -     Status: None   Collection Time: 03/23/20 10:25 AM  Result Value Ref Range Status   SARS Coronavirus 2 by RT PCR NEGATIVE NEGATIVE Final    Comment: (NOTE) SARS-CoV-2 target nucleic acids are NOT DETECTED.  The SARS-CoV-2 RNA is generally detectable in upper respiratoy specimens during the acute phase of infection. The lowest concentration of SARS-CoV-2 viral copies this assay can detect is 131 copies/mL. A negative result does not preclude SARS-Cov-2 infection and should not be used as the sole basis for treatment or other patient management decisions. A negative result may occur with  improper specimen collection/handling, submission of specimen other than nasopharyngeal swab, presence of viral mutation(s) within the areas targeted by this assay, and inadequate number of viral copies (<131 copies/mL). A negative result must be combined with clinical observations, patient history, and epidemiological information. The expected result is Negative.  Fact Sheet for Patients:  https://www.moore.com/  Fact Sheet for Healthcare Providers:  https://www.young.biz/  This test is no t yet approved or cleared by the [place] FDA and  has been authorized for detection and/or diagnosis of SARS-CoV-2 by FDA under an Emergency Use Authorization (EUA). This EUA will remain  in effect (meaning this test can be used) for the duration of the COVID-19 declaration under Section 564(b)(1) of the Act, 21 U.S.C. section 360bbb-3(b)(1), unless the authorization is terminated or revoked sooner.     Influenza A by PCR NEGATIVE NEGATIVE Final   Influenza B by PCR NEGATIVE NEGATIVE Final    Comment: (NOTE) The Xpert Xpress SARS-CoV-2/FLU/RSV assay is intended as an aid in  the diagnosis of influenza from Nasopharyngeal swab specimens  and  should not be used as a sole basis for treatment. Nasal washings and  aspirates are unacceptable for Xpert Xpress SARS-CoV-2/FLU/RSV  testing.  Fact Sheet for Patients: https://www.moore.com/  Fact Sheet for Healthcare Providers: https://www.young.biz/  This test is not yet approved or cleared by the [place] FDA and  has been authorized for detection and/or diagnosis of SARS-CoV-2 by  FDA under an Emergency Use Authorization (EUA). This EUA will remain  in effect (meaning this test can be used) for the duration of the  Covid-19 declaration under Section 564(b)(1) of the Act, 21  U.S.C. section 360bbb-3(b)(1), unless the authorization is  terminated or revoked. Performed at [facility], [address] [place], [place] [address]   Blood culture (routine x 2)     Status: None (Preliminary result)   Collection Time: 03/23/20 12:02 PM   Specimen: BLOOD  Result Value Ref Range Status   Specimen Description   Final  BLOOD BLOOD LEFT FOREARM Performed at [facility], [address], [place], [place] [address]    Special Requests   Final    BOTTLES DRAWN AEROBIC AND ANAEROBIC Blood Culture results may not be optimal due to an inadequate volume of blood received in culture bottles Performed at [facility], [address] [place], [place] [address]    Culture   Final    NO GROWTH 4 DAYS Performed at [facility], [address] [place], [place] [address]    Report Status PENDING  Incomplete  Urine culture     Status: Abnormal   Collection Time: 03/23/20 12:40 PM   Specimen: Urine, Clean Catch  Result Value Ref Range Status   Specimen Description   Final    URINE, CLEAN CATCH Performed at [facility], [address], [place], [place] [address]    Special Requests   Final    NONE Performed at [facility], [address] [place], [place] [address]    Culture (A)  Final    >=100,000 COLONIES/mL AEROCOCCUS SPECIES Standardized susceptibility testing for this organism is not available. Performed at [facility], [address] [place], [place] [address]    Report Status 03/24/2020 FINAL  Final  Blood culture (routine x 2)     Status: Abnormal   Collection Time: 03/23/20 12:40 PM   Specimen: BLOOD RIGHT ARM  Result Value Ref Range Status   Specimen Description   Final    BLOOD RIGHT ARM Performed at [facility], [address], [place], [place] [address]    Special Requests   Final    BOTTLES DRAWN AEROBIC AND ANAEROBIC Blood Culture adequate volume Performed at [facility], [address] [place], [place] [address]    Culture  Setup Time   Final    GRAM POSITIVE COCCI IN CLUSTERS AEROBIC BOTTLE ONLY Organism ID to follow CRITICAL RESULT CALLED TO, READ BACK BY AND VERIFIED WITH: Elmer Sow PharmD 11:35 03/24/20 ([email or web address])    Culture (A)  Final    STAPHYLOCOCCUS EPIDERMIDIS THE SIGNIFICANCE OF ISOLATING THIS ORGANISM FROM A SINGLE SET OF BLOOD CULTURES WHEN MULTIPLE SETS ARE DRAWN IS UNCERTAIN. PLEASE NOTIFY THE MICROBIOLOGY DEPARTMENT WITHIN ONE WEEK IF SPECIATION AND SENSITIVITIES ARE REQUIRED. Performed at [facility], [address], [place], [place] [address]    Report Status 03/26/2020 FINAL  Final  Blood Culture ID Panel (Reflexed)     Status: Abnormal   Collection Time: 03/23/20 12:40 PM  Result Value Ref Range Status   Enterococcus faecalis NOT DETECTED NOT DETECTED Final   Enterococcus Faecium NOT DETECTED NOT DETECTED Final   Listeria monocytogenes NOT DETECTED NOT DETECTED Final   Staphylococcus species DETECTED (A) NOT DETECTED Final    Comment: CRITICAL RESULT CALLED TO, READ BACK BY AND VERIFIED WITH: Elmer Sow PharmD 11:35 03/24/20 ([email or web address])    Staphylococcus aureus (BCID) NOT DETECTED NOT DETECTED Final   Staphylococcus  epidermidis DETECTED (A) NOT DETECTED Final    Comment: CRITICAL RESULT CALLED TO, READ BACK BY AND VERIFIED WITH: Elmer Sow PharmD 11:35 03/24/20 ([email or web address])    Staphylococcus lugdunensis NOT DETECTED NOT DETECTED Final   Streptococcus species NOT DETECTED NOT DETECTED Final   Streptococcus agalactiae NOT DETECTED NOT DETECTED Final   Streptococcus pneumoniae NOT DETECTED NOT DETECTED Final   Streptococcus pyogenes NOT DETECTED NOT DETECTED Final   A.calcoaceticus-baumannii NOT DETECTED NOT DETECTED Final  Bacteroides fragilis NOT DETECTED NOT DETECTED Final   Enterobacterales NOT DETECTED NOT DETECTED Final   Enterobacter cloacae complex NOT DETECTED NOT DETECTED Final   Escherichia coli NOT DETECTED NOT DETECTED Final   Klebsiella aerogenes NOT DETECTED NOT DETECTED Final   Klebsiella oxytoca NOT DETECTED NOT DETECTED Final   Klebsiella pneumoniae NOT DETECTED NOT DETECTED Final   Proteus species NOT DETECTED NOT DETECTED Final   Salmonella species NOT DETECTED NOT DETECTED Final   Serratia marcescens NOT DETECTED NOT DETECTED Final   Haemophilus influenzae NOT DETECTED NOT DETECTED Final   Neisseria meningitidis NOT DETECTED NOT DETECTED Final   Pseudomonas aeruginosa NOT DETECTED NOT DETECTED Final   Stenotrophomonas maltophilia NOT DETECTED NOT DETECTED Final   Candida albicans NOT DETECTED NOT DETECTED Final   Candida auris NOT DETECTED NOT DETECTED Final   Candida glabrata NOT DETECTED NOT DETECTED Final   Candida krusei NOT DETECTED NOT DETECTED Final   Candida parapsilosis NOT DETECTED NOT DETECTED Final   Candida tropicalis NOT DETECTED NOT DETECTED Final   Cryptococcus neoformans/gattii NOT DETECTED NOT DETECTED Final   Methicillin resistance mecA/C NOT DETECTED NOT DETECTED Final    Comment: Performed at [facility], [address], [place], [place] [address]  Respiratory Panel by RT PCR (Flu A&B, Covid) - Nasopharyngeal Swab     Status: None   Collection Time:  03/27/20  1:33 PM   Specimen: Nasopharyngeal Swab  Result Value Ref Range Status   SARS Coronavirus 2 by RT PCR NEGATIVE NEGATIVE Final    Comment: (NOTE) SARS-CoV-2 target nucleic acids are NOT DETECTED.  The SARS-CoV-2 RNA is generally detectable in upper respiratoy specimens during the acute phase of infection. The lowest concentration of SARS-CoV-2 viral copies this assay can detect is 131 copies/mL. A negative result does not preclude SARS-Cov-2 infection and should not be used as the sole basis for treatment or other patient management decisions. A negative result may occur with  improper specimen collection/handling, submission of specimen other than nasopharyngeal swab, presence of viral mutation(s) within the areas targeted by this assay, and inadequate number of viral copies (<131 copies/mL). A negative result must be combined with clinical observations, patient history, and epidemiological information. The expected result is Negative.  Fact Sheet for Patients:  https://www.moore.com/  Fact Sheet for Healthcare Providers:  https://www.young.biz/  This test is no t yet approved or cleared by the [place] FDA and  has been authorized for detection and/or diagnosis of SARS-CoV-2 by FDA under an Emergency Use Authorization (EUA). This EUA will remain  in effect (meaning this test can be used) for the duration of the COVID-19 declaration under Section 564(b)(1) of the Act, 21 U.S.C. section 360bbb-3(b)(1), unless the authorization is terminated or revoked sooner.     Influenza A by PCR NEGATIVE NEGATIVE Final   Influenza B by PCR NEGATIVE NEGATIVE Final    Comment: (NOTE) The Xpert Xpress SARS-CoV-2/FLU/RSV assay is intended as an aid in  the diagnosis of influenza from Nasopharyngeal swab specimens and  should not be used as a sole basis for treatment. Nasal washings and  aspirates are unacceptable for Xpert Xpress  SARS-CoV-2/FLU/RSV  testing.  Fact Sheet for Patients: https://www.moore.com/  Fact Sheet for Healthcare Providers: https://www.young.biz/  This test is not yet approved or cleared by the [place] FDA and  has been authorized for detection and/or diagnosis of SARS-CoV-2 by  FDA under an Emergency Use Authorization (EUA). This EUA will remain  in effect (meaning this test  can be used) for the duration of the  Covid-19 declaration under Section 564(b)(1) of the Act, 21  U.S.C. section 360bbb-3(b)(1), unless the authorization is  terminated or revoked. Performed at [facility], [address] [place], [place] [address]      Labs: Basic Metabolic Panel: Recent Labs  Lab 03/23/20 1240 03/24/20 0603 03/25/20 0348 03/26/20 0453 03/27/20 0405  NA 140 138 142 137 136  K 4.9 4.2 3.7 3.4* 4.0  CL 104 106 111 106 104  CO2 26 22 21* 23 24  GLUCOSE 98 95 82 100* 98  BUN 22 19 16 12 11   CREATININE 1.44* 1.10* 1.01* 0.91 1.03*  CALCIUM 9.2 8.7* 8.8* 8.6* 8.6*   Liver Function Tests: Recent Labs  Lab 03/23/20 1240 03/26/20 0453  AST 19 18  ALT 14 13  ALKPHOS 55 41  BILITOT 0.6 0.5  PROT 8.1 6.2*  ALBUMIN 3.4* 2.7*   CBC: Recent Labs  Lab 03/23/20 1240 03/24/20 0603 03/25/20 0348 03/26/20 0453  WBC 14.1* 10.0 9.8 8.2  NEUTROABS 12.1*  --   --   --   HGB 10.6* 9.0* 8.3* 8.7*  HCT 33.3* 29.2* 27.4* 27.5*  MCV 94.1 94.8 96.8 92.3  PLT 409* 326 314 315   CBG: No results for input(s): GLUCAP in the last 168 hours. Hgb A1c No results for input(s): HGBA1C in the last 72 hours. Lipid Profile No results for input(s): CHOL, HDL, LDLCALC, TRIG, CHOLHDL, LDLDIRECT in the last 72 hours. Thyroid function studies No results for input(s): TSH, T4TOTAL, T3FREE, THYROIDAB in the last 72 hours.  Invalid input(s): FREET3 Urinalysis    Component Value Date/Time   COLORURINE YELLOW 03/23/2020 1240   APPEARANCEUR  CLOUDY (A) 03/23/2020 1240   LABSPEC 1.009 03/23/2020 1240   PHURINE 7.0 03/23/2020 1240   GLUCOSEU NEGATIVE 03/23/2020 1240   HGBUR MODERATE (A) 03/23/2020 1240   BILIRUBINUR NEGATIVE 03/23/2020 1240   KETONESUR NEGATIVE 03/23/2020 1240   PROTEINUR NEGATIVE 03/23/2020 1240   NITRITE NEGATIVE 03/23/2020 1240   LEUKOCYTESUR LARGE (A) 03/23/2020 1240    FURTHER DISCHARGE INSTRUCTIONS:   Get Medicines reviewed and adjusted: Please take all your medications with you for your next visit with your Primary MD   Laboratory/radiological data: Please request your Primary MD to go over all hospital tests and procedure/radiological results at the follow up, please ask your Primary MD to get all Hospital records sent to his/her office.   In some cases, they will be blood work, cultures and biopsy results pending at the time of your discharge. Please request that your primary care M.D. goes through all the records of your hospital data and follows up on these results.   Also Note the following: If you experience worsening of your admission symptoms, develop shortness of breath, life threatening emergency, suicidal or homicidal thoughts you must seek medical attention immediately by calling 911 or calling your MD immediately  if symptoms less severe.   You must read complete instructions/literature along with all the possible adverse reactions/side effects for all the Medicines you take and that have been prescribed to you. Take any new Medicines after you have completely understood and accpet all the possible adverse reactions/side effects.    Do not drive when taking Pain medications or sleeping medications (Benzodaizepines)   Do not take more than prescribed Pain, Sleep and Anxiety Medications. It is not advisable to combine anxiety,sleep and pain medications without talking with your primary care practitioner   Special Instructions: If  you have smoked or chewed Tobacco  in the last 2 yrs please  stop smoking, stop any regular Alcohol  and or any Recreational drug use.   Wear Seat belts while driving.   Please note: You were cared for by a hospitalist during your hospital stay. Once you are discharged, your primary care physician will handle any further medical issues. Please note that NO REFILLS for any discharge medications will be authorized once you are discharged, as it is imperative that you return to your primary care physician (or establish a relationship with a primary care physician if you do not have one) for your post hospital discharge needs so that they can reassess your need for medications and monitor your lab values.  Time coordinating discharge: 35 minutes  SIGNED:  Pamella Pert, MD, PhD 03/28/2020, 9:43 AM

## 2020-03-28 NOTE — [place] FL2 (Signed)
[place] MEDICAID FL2 LEVEL OF CARE SCREENING TOOL     IDENTIFICATION  Patient Name: Veronica Flowers Birthdate: May 18, 1927 Sex: female Admission Date (Current Location): 03/23/2020  [facility] and [place] Number:  Producer, television/film/video and Address:  [facility],  [address] [place] [address]      Provider Number: [id number]  Attending Physician Name and Address:  Leatha Gilding, MD  Relative Name and Phone Number:  MICHAELANN, GUNNOE [facility] [place]) [phone]    Current Level of Care: Hospital Recommended Level of Care: Assisted Living Facility Prior Approval Number:    Date Approved/Denied:   PASRR Number:    Discharge Plan: Domiciliary (Rest home) [facility])    Current Diagnoses: Patient Active Problem List   Diagnosis Date Noted  . Sepsis secondary to UTI (HCC) 03/23/2020  . CKD (chronic kidney disease) stage 3, GFR 30-59 ml/min (HCC) 03/23/2020  . Acute metabolic encephalopathy 03/23/2020  . Left knee pain 05/08/2019  . Confusion 05/08/2019  . Closed displaced supracondylar fracture of distal end of left femur without intracondylar extension (HCC) 09/11/2018  . Unspecified atrial fibrillation (HCC) 09/11/2018  . Chronic anticoagulation 09/11/2018  . Essential hypertension 09/11/2018  . Dementia, presenile with depression (HCC) 09/11/2018  . Displaced supracondylar fracture without intracondylar extension of lower end of left femur, initial encounter for closed fracture (HCC) 09/08/2018    Orientation RESPIRATION BLADDER Height & Weight     Self  Normal Incontinent Weight: 71.7 kg Height:  5\' 7"  (170.2 cm)  BEHAVIORAL SYMPTOMS/MOOD NEUROLOGICAL BOWEL NUTRITION STATUS   (none)   Incontinent Diet (DSII, will need to be fed until mentation improves)  AMBULATORY STATUS COMMUNICATION OF NEEDS Skin   Extensive Assist Verbally Other (Comment) (Pressure injury  R heel, R buttock)                       Personal Care Assistance Level of Assistance   Bathing, Feeding, Dressing Bathing Assistance: Maximum assistance Feeding assistance: Maximum assistance Dressing Assistance: Limited assistance     Functional Limitations Info  Sight, Hearing, Speech Sight Info: Adequate Hearing Info: Adequate Speech Info: Adequate    SPECIAL CARE FACTORS FREQUENCY                       Contractures Contractures Info: Not present    Additional Factors Info  Code Status, Allergies Code Status Info: DNR Allergies Info: NKA           Current Medications (03/28/2020):  This is the current hospital active medication list    Discharge Medications: acetaminophen 500 MG tablet Commonly known as: TYLENOL Take 500 mg by mouth every 6 (six) hours as needed for moderate pain or headache.          Icon medications to start taking   amoxicillin 500 MG capsule Commonly known as: AMOXIL Take 1 capsule (500 mg total) by mouth every 8 (eight) hours for 3 days.          ARIPiprazole 2 MG tablet Commonly known as: ABILIFY Take 2 mg by mouth daily.          ascorbic acid 500 MG tablet Commonly known as: VITAMIN C Take 500 mg by mouth daily.          CENTRUM PO Take 1 tablet by mouth daily.          cholecalciferol 25 MCG (1000 UNIT) tablet Commonly known as: VITAMIN D3 Take 1,000 Units by mouth daily.  digoxin 0.125 MG tablet Commonly known as: LANOXIN Take 0.125 mg by mouth daily.          Eliquis 5 MG Tabs tablet Take 5 mg by mouth 2 (two) times daily. Generic drug: apixaban          feeding supplement Liqd Take 237 mLs by mouth 2 (two) times daily between meals.          fluticasone 50 MCG/ACT nasal spray Commonly known as: FLONASE Place 1 spray into both nostrils 2 (two) times a day.          furosemide 20 MG tablet Commonly known as: LASIX Take 20 mg by mouth daily.          HYDROcodone-acetaminophen 5-325 MG tablet Commonly known as: NORCO/VICODIN Take 1 tablet by mouth every 6 (six) hours as needed for moderate pain  or severe pain.          loratadine 10 MG tablet Commonly known as: CLARITIN Take 10 mg by mouth at bedtime.          megestrol 40 MG/ML suspension Commonly known as: MEGACE Take 20 mLs by mouth daily.          Melatonin 3 MG Caps Take 3 mg by mouth at bedtime.          metoprolol succinate 100 MG 24 hr tablet Commonly known as: TOPROL-XL Take 100 mg by mouth daily.          polyethylene glycol 17 g packet Commonly known as: MIRALAX / GLYCOLAX Take 17 g by mouth daily.          potassium chloride 10 MEQ CR capsule Commonly known as: MICRO-K Take 10 mEq by mouth daily.          senna 8.6 MG Tabs tablet Commonly known as: SENOKOT Take 1 tablet by mouth at bedtime.          sertraline 100 MG tablet Commonly known as: ZOLOFT Take 150 mg by mouth daily.          traMADol 50 MG tablet Commonly known as: ULTRAM Take 1 tablet (50 mg total) by mouth every 6 (six) hours as needed for moderate pain.            Relevant Imaging Results:  Relevant Lab Results:   Additional Information [id number]  Baldo Daub [place], [place]

## 2020-03-28 NOTE — TOC Transition Note (Signed)
Transition of Care [facility]) - CM/SW Discharge Note   Patient Details  Name: Veronica Flowers MRN: [id number] Date of Birth: August 21, 1927  [facility]) CM/SW Contact:  Ida Rogue, LCSW Phone Number: 03/28/2020, 12:59 PM   Clinical Narrative:   Sherron Monday to JJ at facility who reviewed FAXed information, including PT and speech notes, FL2 and D/C summary.  Made requested changes to FL2. Given green light to send patient back today. PTAR arranged.  Nursing, please call report to [phone], room 27A.  TOC sign off.    Final next level of care: Assisted Living Barriers to Discharge: No Barriers Identified   Patient Goals and CMS Choice        Discharge Placement                       Discharge Plan and Services                                     Social Determinants of Health (SDOH) Interventions     Readmission Risk Interventions No flowsheet data found.

## 2020-03-28 NOTE — Care Management Important Message (Signed)
Important Message  Patient Details IM Letter given to the Patient. Name: Veronica Flowers MRN: [id number] Date of Birth: 02/17/1928   Medicare Important Message Given:  Yes     Caren Macadam 03/28/2020, 11:19 AM

## 2020-03-28 NOTE — Progress Notes (Signed)
Report called to JJ at [facility]. No questions and verbalized understanding.

## 2020-03-28 NOTE — Plan of Care (Signed)

## 2020-03-28 NOTE — [place] FL2 (Signed)
[place] MEDICAID FL2 LEVEL OF CARE SCREENING TOOL     IDENTIFICATION  Patient Name: Veronica Flowers Birthdate: 1928/04/29 Sex: female Admission Date (Current Location): 03/23/2020  [facility] and [place] Number:  Producer, television/film/video and Address:  [facility],  [address], [place] [address]      Provider Number: [id number]  Attending Physician Name and Address:  Leatha Gilding, MD  Relative Name and Phone Number:  SAGRARIO, LINEBERRY [facility]) [phone]    Current Level of Care: Hospital Recommended Level of Care: Assisted Living Facility Prior Approval Number:    Date Approved/Denied:   PASRR Number:    Discharge Plan: Domiciliary (Rest home) [facility])    Current Diagnoses: Patient Active Problem List   Diagnosis Date Noted  . Sepsis secondary to UTI (HCC) 03/23/2020  . CKD (chronic kidney disease) stage 3, GFR 30-59 ml/min (HCC) 03/23/2020  . Acute metabolic encephalopathy 03/23/2020  . Left knee pain 05/08/2019  . Confusion 05/08/2019  . Closed displaced supracondylar fracture of distal end of left femur without intracondylar extension (HCC) 09/11/2018  . Unspecified atrial fibrillation (HCC) 09/11/2018  . Chronic anticoagulation 09/11/2018  . Essential hypertension 09/11/2018  . Dementia, presenile with depression (HCC) 09/11/2018  . Displaced supracondylar fracture without intracondylar extension of lower end of left femur, initial encounter for closed fracture (HCC) 09/08/2018    Orientation RESPIRATION BLADDER Height & Weight     Self  Normal Incontinent Weight: 71.7 kg Height:  5\' 7"  (170.2 cm)  BEHAVIORAL SYMPTOMS/MOOD NEUROLOGICAL BOWEL NUTRITION STATUS   (none)   Incontinent Diet (textured modified diet, thin liquids)  AMBULATORY STATUS COMMUNICATION OF NEEDS Skin   Extensive Assist Verbally Other (Comment) (Pressure injury  R heel, R buttock)                       Personal Care Assistance Level of Assistance  Bathing,  Feeding, Dressing Bathing Assistance: Maximum assistance Feeding assistance: Maximum assistance Dressing Assistance: Limited assistance     Functional Limitations Info  Sight, Hearing, Speech Sight Info: Adequate Hearing Info: Adequate Speech Info: Adequate    SPECIAL CARE FACTORS FREQUENCY                       Contractures Contractures Info: Not present    Additional Factors Info  Code Status, Allergies Code Status Info: DNR Allergies Info: NKA           Current Medications (03/28/2020):  This is the current hospital active medication list Current Facility-Administered Medications  Medication Dose Route Frequency Provider Last Rate Last Admin  . amoxicillin (AMOXIL) capsule 500 mg  500 mg Oral Q8H 03/30/2020, MD   500 mg at 03/28/20 0513  . apixaban (ELIQUIS) tablet 5 mg  5 mg Oral BID 03/30/20, RPH   5 mg at 03/28/20 0957  . ARIPiprazole (ABILIFY) tablet 2 mg  2 mg Oral Daily 03/30/20, MD   2 mg at 03/28/20 0957  . chlorhexidine (PERIDEX) 0.12 % solution 15 mL  15 mL Mouth Rinse BID 03/30/20, MD   15 mL at 03/27/20 2154  . digoxin (LANOXIN) tablet 0.125 mg  0.125 mg Oral Daily 2155 M, MD   0.125 mg at 03/28/20 0957  . furosemide (LASIX) tablet 20 mg  20 mg Oral Daily 03/30/20, MD   20 mg at 03/28/20 0957  . MEDLINE mouth rinse  15 mL Mouth Rinse  [id number] Leatha Gilding, MD   15 mL at 03/27/20 1605  . megestrol (MEGACE) 400 MG/10ML suspension 800 mg  800 mg Oral Daily Leatha Gilding, MD   800 mg at 03/28/20 0958  . metoprolol succinate (TOPROL-XL) 24 hr tablet 50 mg  50 mg Oral Daily Leatha Gilding, MD   50 mg at 03/28/20 0957  . sertraline (ZOLOFT) tablet 150 mg  150 mg Oral Daily Leatha Gilding, MD   150 mg at 03/28/20 0957  . sodium chloride flush (NS) 0.9 % injection 3 mL  3 mL Intravenous Q12H Jae Dire, MD   3 mL at 03/28/20 6226     Discharge Medications: Please see discharge summary for a list  of discharge medications.  Relevant Imaging Results:  Relevant Lab Results:   Additional Information SN:[id number]  Baldo Daub [place], [place]

## 2020-06-14 DEATH — deceased

## 2021-05-20 IMAGING — CT CT HEAD W/O CM
3 of 4 series · 16 of 47 positions shown, 19 images · non-contrast
Comparison: 08/13/2019

CLINICAL DATA: Altered mental status since yesterday.

EXAM:
CT HEAD WITHOUT CONTRAST
TECHNIQUE: Contiguous axial images were obtained from the base of the skull
through the vertex without intravenous contrast.

[Series 2: head wo · axial · 0.52mm/px · z∈[-150,-20]mm · 10 of 32 slices shown, 13 images]
[im 3/32  brain]
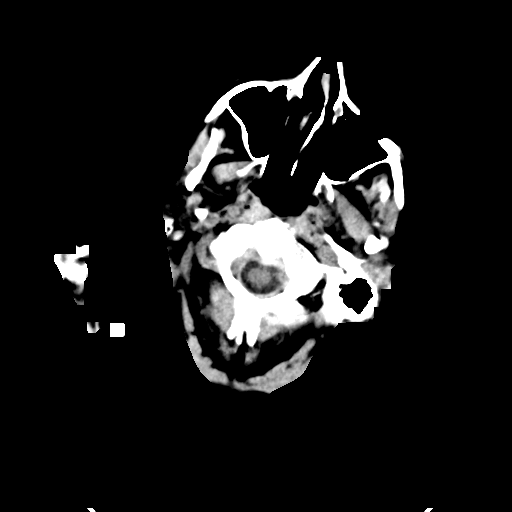
[im 3/32  bone]
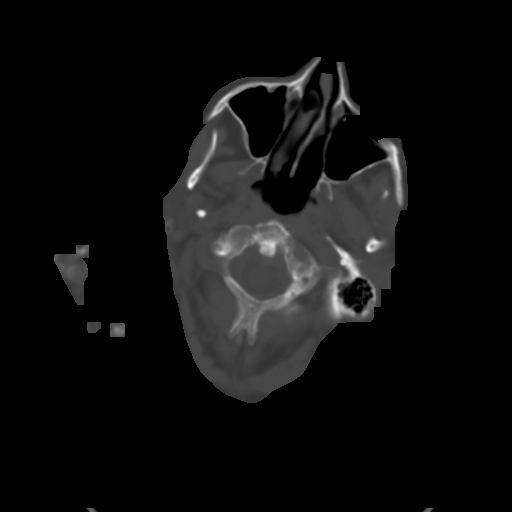
[im 5/32  brain]
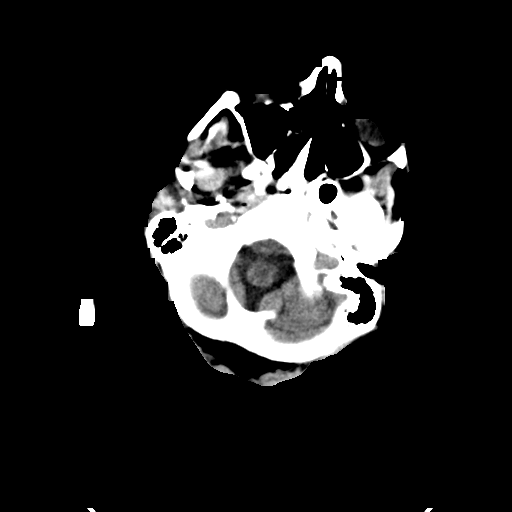
[im 9/32  brain]
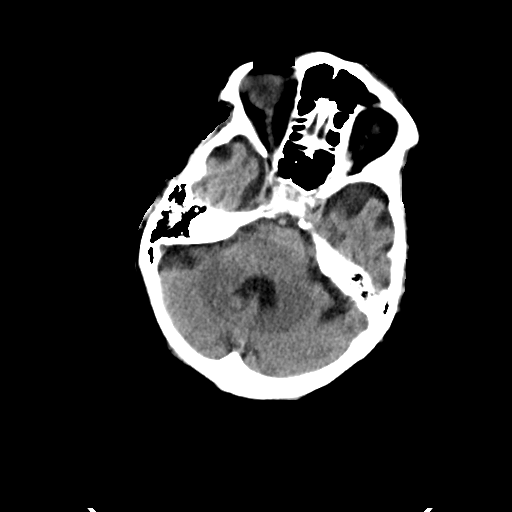
[im 12/32  brain]
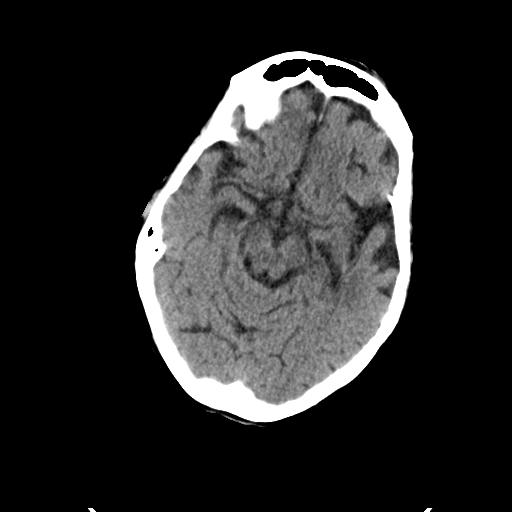
[im 14/32  brain]
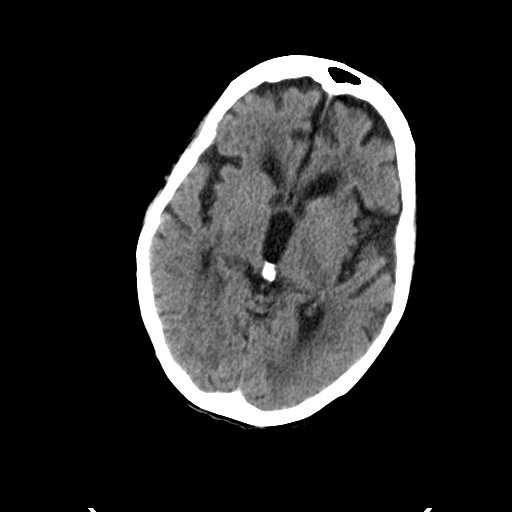
[im 14/32  bone]
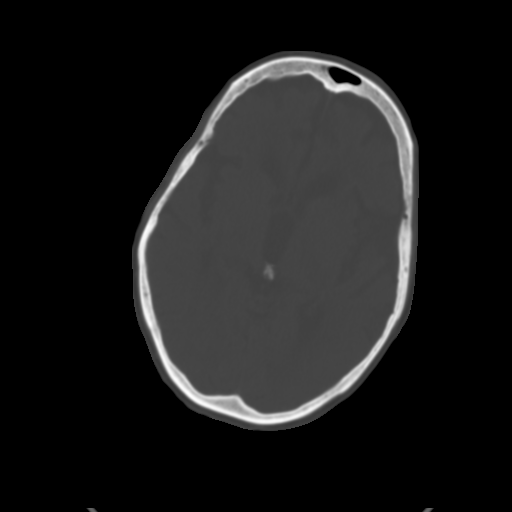
[im 18/32  brain]
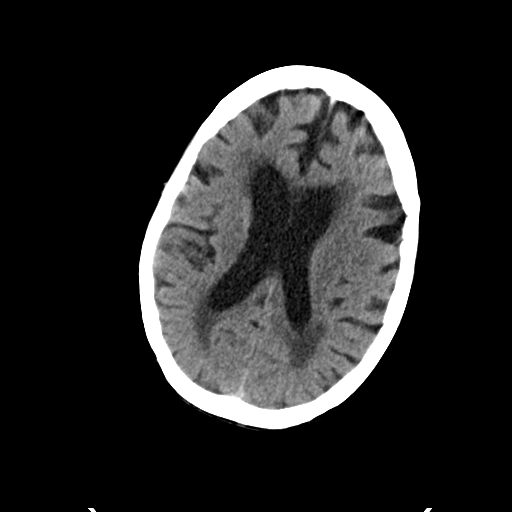
[im 20/32  brain]
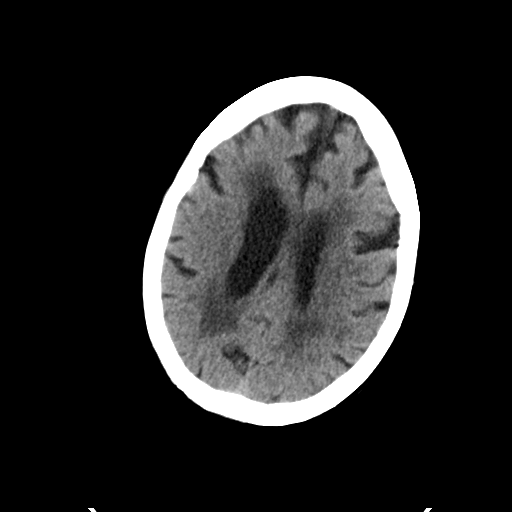
[im 23/32  brain]
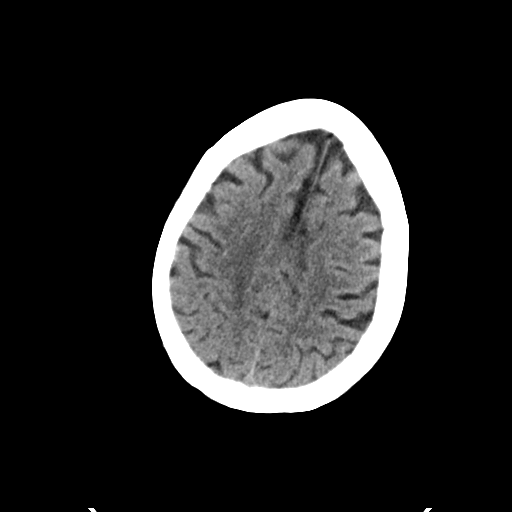
[im 27/32  brain]
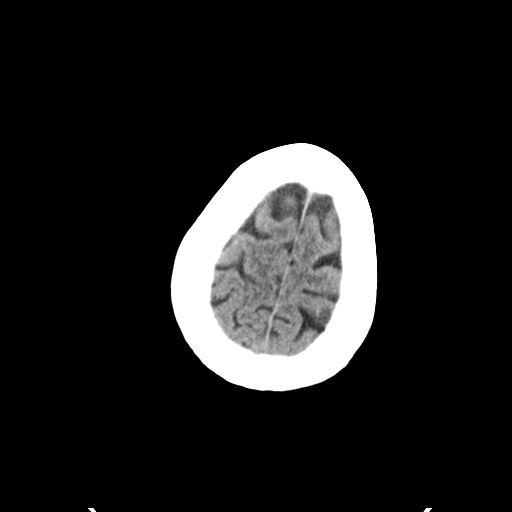
[im 27/32  bone]
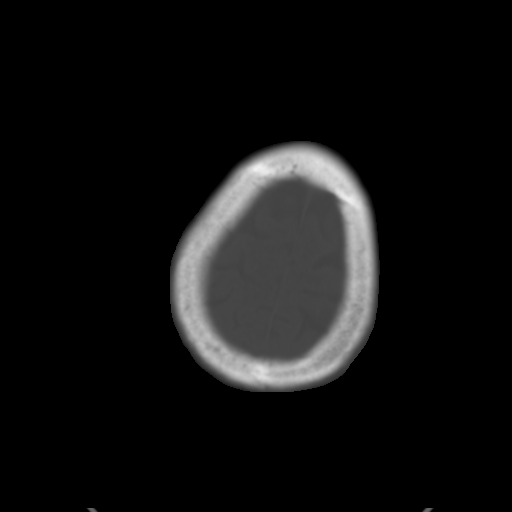
[im 29/32  brain]
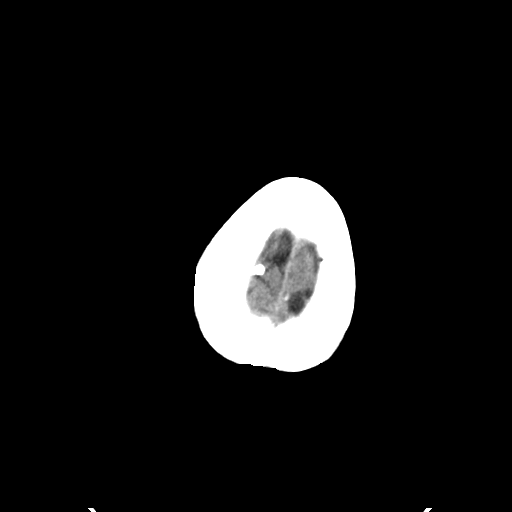

[Series 4: coronal soft tissue · coronal · 0.34mm/px · 3 of 72 slices shown]
[im 24/72  brain]
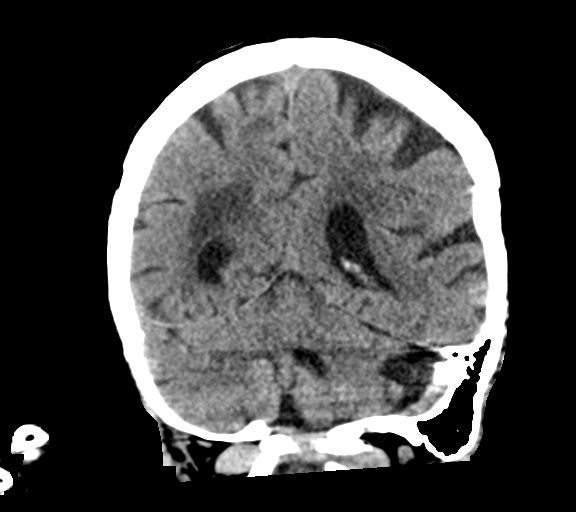
[im 32/72  brain]
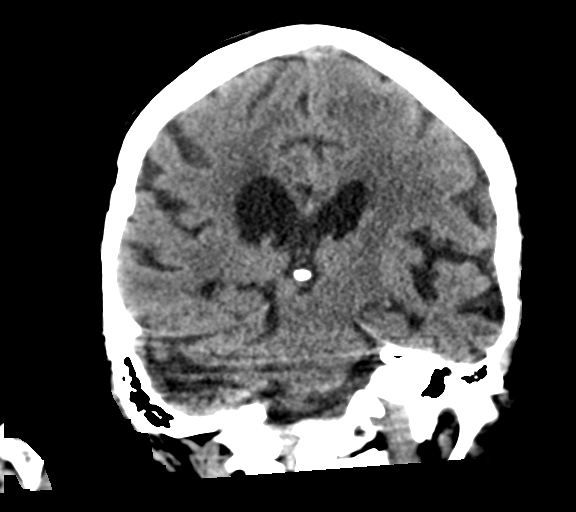
[im 40/72  brain]
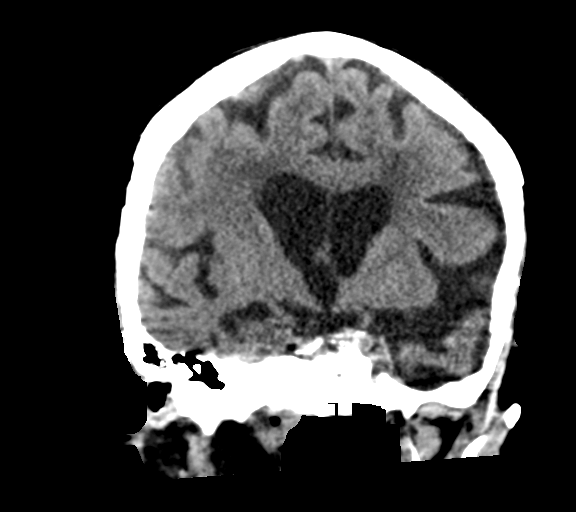

[Series 5: sagittal soft tissue · sagittal · 0.43mm/px · 3 of 50 slices shown]
[im 17/50  brain]
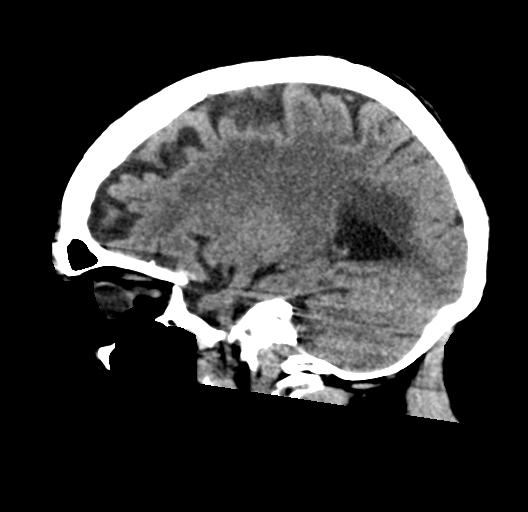
[im 25/50  brain]
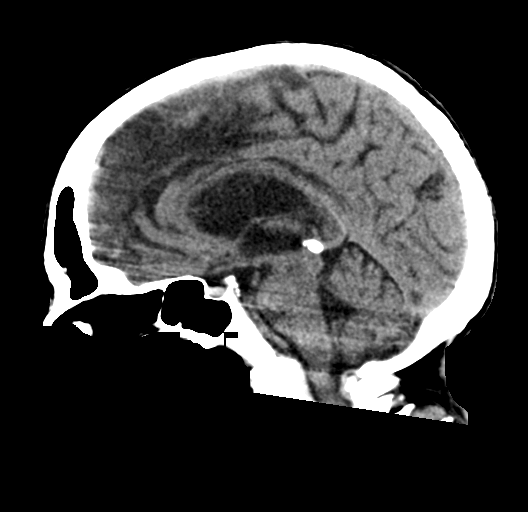
[im 33/50  brain]
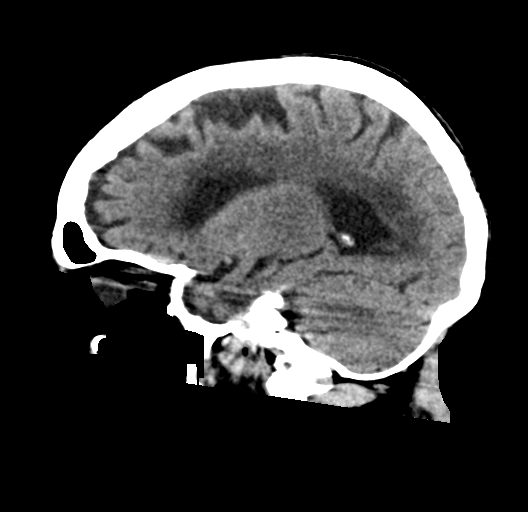

[16 of 47 positions shown; findings below may reference images not displayed]

FINDINGS: Brain: Age related atrophy. No focal abnormality affects the
brainstem or cerebellum. Cerebral hemispheres show advanced
confluent chronic small vessel ischemic changes of the white matter.
No mass, hemorrhage, hydrocephalus or extra-axial collection.

Vascular: No abnormal vascular finding.

Skull: Negative

Sinuses/Orbits: Clear/normal

Other: None
IMPRESSION: No acute finding by CT. Age related atrophy. Advanced chronic
small-vessel ischemic changes of the cerebral hemispheric white
matter.

## 2021-05-20 IMAGING — DX DG CHEST 1V PORT
1 series · 1 of 1 positions shown · non-contrast
Comparison: 05/08/2019

CLINICAL DATA: Weakness

EXAM:
PORTABLE CHEST 1 VIEW

[chest ap]
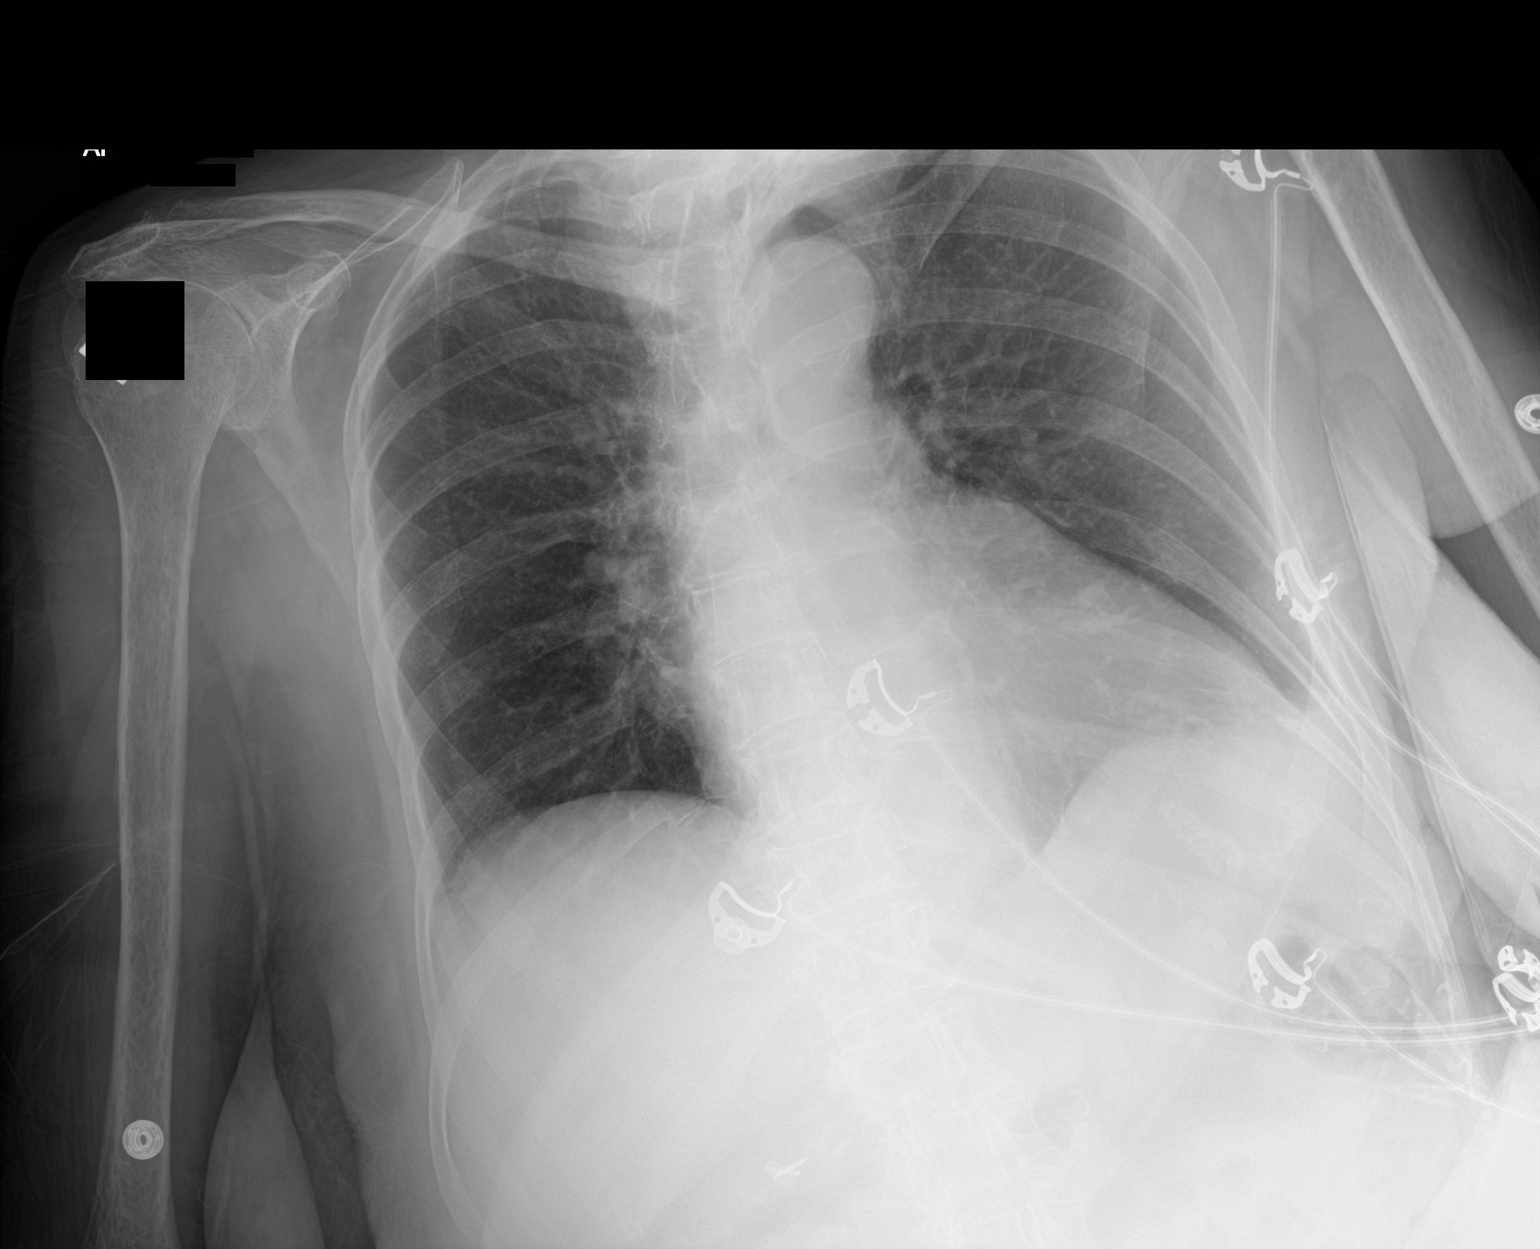

[1 of 1 positions shown; findings below may reference images not displayed]

FINDINGS: Heart size remains mildly enlarged. Atherosclerotic calcification of
the aortic knob. No focal airspace consolidation, pleural effusion,
or pneumothorax.
IMPRESSION: No active disease.
